# Patient Record
Sex: Male | Born: 1935 | Race: White | Hispanic: No | Marital: Married | Smoking: Never smoker
Health system: Southern US, Community
[De-identification: ages and names within clinical notes are randomized; demographics above are authoritative.]

## PROBLEM LIST (undated history)

## (undated) DIAGNOSIS — S065X9A Traumatic subdural hemorrhage with loss of consciousness of unspecified duration, initial encounter: Secondary | ICD-10-CM

## (undated) DIAGNOSIS — G459 Transient cerebral ischemic attack, unspecified: Secondary | ICD-10-CM

## (undated) DIAGNOSIS — I442 Atrioventricular block, complete: Secondary | ICD-10-CM

## (undated) DIAGNOSIS — I10 Essential (primary) hypertension: Secondary | ICD-10-CM

## (undated) DIAGNOSIS — I714 Abdominal aortic aneurysm, without rupture, unspecified: Secondary | ICD-10-CM

## (undated) DIAGNOSIS — E7800 Pure hypercholesterolemia, unspecified: Secondary | ICD-10-CM

## (undated) DIAGNOSIS — I4891 Unspecified atrial fibrillation: Principal | ICD-10-CM

## (undated) DIAGNOSIS — M1990 Unspecified osteoarthritis, unspecified site: Secondary | ICD-10-CM

## (undated) DIAGNOSIS — S065XAA Traumatic subdural hemorrhage with loss of consciousness status unknown, initial encounter: Secondary | ICD-10-CM

## (undated) DIAGNOSIS — E119 Type 2 diabetes mellitus without complications: Secondary | ICD-10-CM

## (undated) DIAGNOSIS — R32 Unspecified urinary incontinence: Secondary | ICD-10-CM

## (undated) HISTORY — PX: SUBDURAL HEMATOMA EVACUATION VIA CRANIOTOMY: SUR319

## (undated) HISTORY — PX: HERNIA REPAIR: SHX51

## (undated) HISTORY — PX: TONSILLECTOMY: SUR1361

## (undated) HISTORY — PX: ABDOMINAL AORTIC ANEURYSM REPAIR: SUR1152

---

## 2013-05-06 ENCOUNTER — Encounter (HOSPITAL_COMMUNITY): Payer: Self-pay | Admitting: Emergency Medicine

## 2013-05-06 ENCOUNTER — Inpatient Hospital Stay (HOSPITAL_COMMUNITY)
Admission: EM | Admit: 2013-05-06 | Discharge: 2013-05-12 | DRG: 242 | Disposition: A | Discharge status: Home / Self Care | Payer: Medicare Other | Attending: Internal Medicine | Admitting: Internal Medicine

## 2013-05-06 ENCOUNTER — Emergency Department (HOSPITAL_COMMUNITY): Payer: Medicare Other

## 2013-05-06 DIAGNOSIS — J9600 Acute respiratory failure, unspecified whether with hypoxia or hypercapnia: Secondary | ICD-10-CM

## 2013-05-06 DIAGNOSIS — I509 Heart failure, unspecified: Secondary | ICD-10-CM | POA: Diagnosis present

## 2013-05-06 DIAGNOSIS — I10 Essential (primary) hypertension: Secondary | ICD-10-CM | POA: Diagnosis present

## 2013-05-06 DIAGNOSIS — E876 Hypokalemia: Secondary | ICD-10-CM | POA: Diagnosis not present

## 2013-05-06 DIAGNOSIS — I498 Other specified cardiac arrhythmias: Secondary | ICD-10-CM | POA: Diagnosis present

## 2013-05-06 DIAGNOSIS — J9601 Acute respiratory failure with hypoxia: Secondary | ICD-10-CM | POA: Diagnosis present

## 2013-05-06 DIAGNOSIS — Z87891 Personal history of nicotine dependence: Secondary | ICD-10-CM

## 2013-05-06 DIAGNOSIS — Z91041 Radiographic dye allergy status: Secondary | ICD-10-CM

## 2013-05-06 DIAGNOSIS — J189 Pneumonia, unspecified organism: Secondary | ICD-10-CM | POA: Diagnosis present

## 2013-05-06 DIAGNOSIS — Z8673 Personal history of transient ischemic attack (TIA), and cerebral infarction without residual deficits: Secondary | ICD-10-CM

## 2013-05-06 DIAGNOSIS — Z79899 Other long term (current) drug therapy: Secondary | ICD-10-CM

## 2013-05-06 DIAGNOSIS — I442 Atrioventricular block, complete: Secondary | ICD-10-CM | POA: Diagnosis present

## 2013-05-06 DIAGNOSIS — J209 Acute bronchitis, unspecified: Secondary | ICD-10-CM | POA: Diagnosis present

## 2013-05-06 DIAGNOSIS — I5031 Acute diastolic (congestive) heart failure: Secondary | ICD-10-CM | POA: Diagnosis present

## 2013-05-06 DIAGNOSIS — I359 Nonrheumatic aortic valve disorder, unspecified: Secondary | ICD-10-CM | POA: Diagnosis present

## 2013-05-06 DIAGNOSIS — M7989 Other specified soft tissue disorders: Secondary | ICD-10-CM

## 2013-05-06 DIAGNOSIS — Z8249 Family history of ischemic heart disease and other diseases of the circulatory system: Secondary | ICD-10-CM

## 2013-05-06 DIAGNOSIS — E119 Type 2 diabetes mellitus without complications: Secondary | ICD-10-CM | POA: Diagnosis present

## 2013-05-06 DIAGNOSIS — R001 Bradycardia, unspecified: Secondary | ICD-10-CM | POA: Diagnosis present

## 2013-05-06 DIAGNOSIS — I4891 Unspecified atrial fibrillation: Principal | ICD-10-CM | POA: Diagnosis present

## 2013-05-06 DIAGNOSIS — Z7982 Long term (current) use of aspirin: Secondary | ICD-10-CM

## 2013-05-06 DIAGNOSIS — E785 Hyperlipidemia, unspecified: Secondary | ICD-10-CM | POA: Diagnosis present

## 2013-05-06 HISTORY — DX: Atrioventricular block, complete: I44.2

## 2013-05-06 HISTORY — DX: Transient cerebral ischemic attack, unspecified: G45.9

## 2013-05-06 HISTORY — DX: Type 2 diabetes mellitus without complications: E11.9

## 2013-05-06 HISTORY — DX: Abdominal aortic aneurysm, without rupture, unspecified: I71.40

## 2013-05-06 HISTORY — DX: Abdominal aortic aneurysm, without rupture: I71.4

## 2013-05-06 HISTORY — DX: Unspecified osteoarthritis, unspecified site: M19.90

## 2013-05-06 HISTORY — DX: Traumatic subdural hemorrhage with loss of consciousness status unknown, initial encounter: S06.5XAA

## 2013-05-06 HISTORY — DX: Pure hypercholesterolemia, unspecified: E78.00

## 2013-05-06 HISTORY — DX: Unspecified urinary incontinence: R32

## 2013-05-06 HISTORY — DX: Unspecified atrial fibrillation: I48.91

## 2013-05-06 HISTORY — DX: Traumatic subdural hemorrhage with loss of consciousness of unspecified duration, initial encounter: S06.5X9A

## 2013-05-06 HISTORY — DX: Essential (primary) hypertension: I10

## 2013-05-06 LAB — COMPREHENSIVE METABOLIC PANEL
ALT: 49 U/L (ref 0–53)
AST: 45 U/L — ABNORMAL HIGH (ref 0–37)
Albumin: 3.5 g/dL (ref 3.5–5.2)
Alkaline Phosphatase: 107 U/L (ref 39–117)
BILIRUBIN TOTAL: 1.2 mg/dL (ref 0.3–1.2)
BUN: 13 mg/dL (ref 6–23)
CALCIUM: 9.3 mg/dL (ref 8.4–10.5)
CHLORIDE: 100 meq/L (ref 96–112)
CO2: 21 meq/L (ref 19–32)
Creatinine, Ser: 0.92 mg/dL (ref 0.50–1.35)
GFR calc Af Amer: 89 mL/min — ABNORMAL LOW (ref >90)
GFR calc non Af Amer: 77 mL/min — ABNORMAL LOW (ref >90)
Glucose, Bld: 185 mg/dL — ABNORMAL HIGH (ref 70–99)
POTASSIUM: 4.1 meq/L (ref 3.7–5.3)
Sodium: 137 mEq/L (ref 137–147)
Total Protein: 7.2 g/dL (ref 6.0–8.3)

## 2013-05-06 LAB — CBC WITH DIFFERENTIAL/PLATELET
Basophils Absolute: 0 10*3/uL (ref 0.0–0.1)
Basophils Relative: 0 % (ref 0–1)
EOS ABS: 0 10*3/uL (ref 0.0–0.7)
Eosinophils Relative: 0 % (ref 0–5)
HCT: 42.3 % (ref 39.0–52.0)
HEMOGLOBIN: 14.4 g/dL (ref 13.0–17.0)
Lymphocytes Relative: 11 % — ABNORMAL LOW (ref 12–46)
Lymphs Abs: 1.2 10*3/uL (ref 0.7–4.0)
MCH: 31.1 pg (ref 26.0–34.0)
MCHC: 34 g/dL (ref 30.0–36.0)
MCV: 91.4 fL (ref 78.0–100.0)
MONO ABS: 0.8 10*3/uL (ref 0.1–1.0)
MONOS PCT: 8 % (ref 3–12)
NEUTROS PCT: 81 % — AB (ref 43–77)
Neutro Abs: 8.3 10*3/uL — ABNORMAL HIGH (ref 1.7–7.7)
Platelets: 147 10*3/uL — ABNORMAL LOW (ref 150–400)
RBC: 4.63 MIL/uL (ref 4.22–5.81)
RDW: 14.6 % (ref 11.5–15.5)
WBC: 10.3 10*3/uL (ref 4.0–10.5)

## 2013-05-06 LAB — TROPONIN I
Troponin I: <0.3 ng/mL (ref <0.30)
Troponin I: <0.3 ng/mL (ref <0.30)

## 2013-05-06 LAB — I-STAT TROPONIN, ED: Troponin i, poc: 0.05 ng/mL (ref 0.00–0.08)

## 2013-05-06 LAB — PRO B NATRIURETIC PEPTIDE: Pro B Natriuretic peptide (BNP): 4582 pg/mL — ABNORMAL HIGH (ref 0–450)

## 2013-05-06 LAB — TSH: TSH: 1.36 u[IU]/mL (ref 0.350–4.500)

## 2013-05-06 LAB — GLUCOSE, CAPILLARY: GLUCOSE-CAPILLARY: 175 mg/dL — AB (ref 70–99)

## 2013-05-06 LAB — STREP PNEUMONIAE URINARY ANTIGEN: Strep Pneumo Urinary Antigen: NEGATIVE

## 2013-05-06 LAB — MAGNESIUM: MAGNESIUM: 1.6 mg/dL (ref 1.5–2.5)

## 2013-05-06 MED ORDER — PANTOPRAZOLE SODIUM 40 MG PO TBEC
40.0000 mg | DELAYED_RELEASE_TABLET | Freq: Every day | ORAL | Status: DC
  Administered 2013-05-06 – 2013-05-12 (×7): 40 mg via ORAL
  Filled 2013-05-06 (×7): qty 1

## 2013-05-06 MED ORDER — ATORVASTATIN CALCIUM 40 MG PO TABS
40.0000 mg | ORAL_TABLET | Freq: Every day | ORAL | Status: DC
  Administered 2013-05-06 – 2013-05-11 (×5): 40 mg via ORAL
  Filled 2013-05-06 (×7): qty 1

## 2013-05-06 MED ORDER — SODIUM CHLORIDE 0.9 % IJ SOLN: 3.0000 mL | INTRAMUSCULAR | Status: DC | PRN

## 2013-05-06 MED ORDER — FUROSEMIDE 10 MG/ML IJ SOLN
60.0000 mg | Freq: Two times a day (BID) | INTRAMUSCULAR | Status: DC
  Filled 2013-05-06 (×3): qty 6

## 2013-05-06 MED ORDER — DEXTROSE 5 % IV SOLN
1.0000 g | Freq: Once | INTRAVENOUS | Status: AC
  Administered 2013-05-06: 1 g via INTRAVENOUS
  Filled 2013-05-06: qty 10

## 2013-05-06 MED ORDER — ENOXAPARIN SODIUM 40 MG/0.4ML ~~LOC~~ SOLN
40.0000 mg | SUBCUTANEOUS | Status: DC
  Administered 2013-05-06 – 2013-05-09 (×4): 40 mg via SUBCUTANEOUS
  Filled 2013-05-06 (×5): qty 0.4

## 2013-05-06 MED ORDER — SODIUM CHLORIDE 0.9 % IJ SOLN
3.0000 mL | Freq: Two times a day (BID) | INTRAMUSCULAR | Status: DC
  Administered 2013-05-06: 17:00:00 via INTRAVENOUS
  Administered 2013-05-06 – 2013-05-10 (×8): 3 mL via INTRAVENOUS

## 2013-05-06 MED ORDER — LISINOPRIL 20 MG PO TABS
20.0000 mg | ORAL_TABLET | Freq: Every day | ORAL | Status: DC
  Filled 2013-05-06: qty 1

## 2013-05-06 MED ORDER — IPRATROPIUM-ALBUTEROL 0.5-2.5 (3) MG/3ML IN SOLN
3.0000 mL | RESPIRATORY_TRACT | Status: DC
  Administered 2013-05-06: 3 mL via RESPIRATORY_TRACT
  Filled 2013-05-06: qty 3

## 2013-05-06 MED ORDER — INSULIN ASPART 100 UNIT/ML ~~LOC~~ SOLN
0.0000 [IU] | Freq: Three times a day (TID) | SUBCUTANEOUS | Status: DC
  Administered 2013-05-06: 3 [IU] via SUBCUTANEOUS
  Administered 2013-05-07: 5 [IU] via SUBCUTANEOUS
  Administered 2013-05-07 (×2): 3 [IU] via SUBCUTANEOUS
  Administered 2013-05-08: 18:00:00 via SUBCUTANEOUS
  Administered 2013-05-08 (×2): 3 [IU] via SUBCUTANEOUS
  Administered 2013-05-09: 2 [IU] via SUBCUTANEOUS
  Administered 2013-05-09 – 2013-05-10 (×3): 3 [IU] via SUBCUTANEOUS
  Administered 2013-05-10: 2 [IU] via SUBCUTANEOUS
  Administered 2013-05-11: 3 [IU] via SUBCUTANEOUS
  Administered 2013-05-11 (×2): 2 [IU] via SUBCUTANEOUS

## 2013-05-06 MED ORDER — ASPIRIN 325 MG PO TABS
325.0000 mg | ORAL_TABLET | Freq: Every evening | ORAL | Status: DC
  Administered 2013-05-06 – 2013-05-11 (×5): 325 mg via ORAL
  Filled 2013-05-06 (×7): qty 1

## 2013-05-06 MED ORDER — DEXTROSE 5 % IV SOLN
500.0000 mg | Freq: Once | INTRAVENOUS | Status: AC
  Administered 2013-05-06: 500 mg via INTRAVENOUS

## 2013-05-06 MED ORDER — AMANTADINE HCL 100 MG PO CAPS
100.0000 mg | ORAL_CAPSULE | Freq: Every day | ORAL | Status: DC
  Administered 2013-05-07 – 2013-05-12 (×6): 100 mg via ORAL
  Filled 2013-05-06 (×6): qty 1

## 2013-05-06 MED ORDER — DEXTROSE 5 % IV SOLN
1.0000 g | INTRAVENOUS | Status: DC
  Administered 2013-05-07: 1 g via INTRAVENOUS
  Filled 2013-05-06 (×2): qty 10

## 2013-05-06 MED ORDER — SODIUM CHLORIDE 0.9 % IV SOLN: 250.0000 mL | INTRAVENOUS | Status: DC | PRN

## 2013-05-06 MED ORDER — DEXTROSE 5 % IV SOLN
500.0000 mg | INTRAVENOUS | Status: DC
  Administered 2013-05-07: 500 mg via INTRAVENOUS
  Filled 2013-05-06 (×2): qty 500

## 2013-05-06 MED ORDER — FUROSEMIDE 10 MG/ML IJ SOLN
40.0000 mg | Freq: Once | INTRAMUSCULAR | Status: AC
  Administered 2013-05-06: 40 mg via INTRAVENOUS
  Filled 2013-05-06: qty 4

## 2013-05-06 MED ORDER — FINASTERIDE 5 MG PO TABS
5.0000 mg | ORAL_TABLET | Freq: Every day | ORAL | Status: DC
  Administered 2013-05-06 – 2013-05-12 (×7): 5 mg via ORAL
  Filled 2013-05-06 (×7): qty 1

## 2013-05-06 MED ORDER — FUROSEMIDE 10 MG/ML IJ SOLN
60.0000 mg | Freq: Two times a day (BID) | INTRAMUSCULAR | Status: DC
Start: 2013-05-07 — End: 2013-05-06

## 2013-05-06 NOTE — ED Notes (Signed)
Repot given-transfer to [phone]

## 2013-05-06 NOTE — ED Notes (Signed)
CMP and CBC with Differential yellow labels not found/printed. Natasha from lab confirms okay to sent those with regular patient labels.

## 2013-05-06 NOTE — ED Notes (Signed)
Off floor for testing 

## 2013-05-06 NOTE — ED Notes (Signed)
Rachel Covil at bedside attempting second blood culture draw.

## 2013-05-06 NOTE — H&P (Addendum)
[facility] History and Physical  Grant Peck PYK:[id number] DOB: 1931-01-05 DOA: 05/06/2013   PCP: Dr. Mervyn Peck? in [facility] Specialists: He is followed by a cardiologist in [place] area  Chief Complaint: Shortness of breath and cough for the last 2 days  HPI: Grant Peck is a 80 y.o. male with a past medical history of atrial fibrillation not on anticoagulation, diabetes mellitus type II, hypertension, who denies any history of congestive heart failure who was in his usual state of health about 2 days ago, when he started developing shortness of breath, and cough. He's felt very weak and fatigued. He's had a greenish expectoration with his cough. Denies any fever or chills. No sick contacts. The symptoms slowly got worse and he decided to come in to the hospital today. He had an episode of vomiting 2 days ago, but none since then. He is from [place] visiting his son in [place] as his son was newly diagnosed with throat cancer and underwent a surgical procedure recently. Denies any leg swelling. 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 It was an hour and a half flight, but he walked many times during this flight according to the patient. He denies any chest pain. No dizziness or lightheadedness. He was given a breathing treatment earlier today and he is feeling little better. He is yet to receive his dose of Lasix. Denies any recent hospitalization.  Home Medications: Prior to Admission medications   Medication Sig Start Date End Date Taking? Authorizing Provider  amantadine (SYMMETREL) 100 MG capsule Take 100 mg by mouth daily.   Yes Historical Provider, MD  aspirin 325 MG tablet Take 325 mg by mouth every evening.   Yes Historical Provider, MD  dextromethorphan (DELSYM) 30 MG/5ML liquid Take 30 mLs by mouth as needed for cough.   Yes Historical Provider, MD  diltiazem (CARDIZEM CD) 120 MG 24 hr capsule Take 120 mg by mouth daily.   Yes Historical  Provider, MD  finasteride (PROSCAR) 5 MG tablet Take 5 mg by mouth daily.   Yes Historical Provider, MD  fosinopril (MONOPRIL) 20 MG tablet Take 20 mg by mouth daily.   Yes Historical Provider, MD  metFORMIN (GLUCOPHAGE) 500 MG tablet Take 750 mg by mouth 2 (two) times daily with a meal.   Yes Historical Provider, MD  metoprolol succinate (TOPROL-XL) 50 MG 24 hr tablet Take 50 mg by mouth daily. Take with or immediately following a meal.   Yes Historical Provider, MD  omeprazole (PRILOSEC) 40 MG capsule Take 40 mg by mouth daily.   Yes Historical Provider, MD  Polyvinyl Alcohol-Povidone (CLEAR EYES ALL SEASONS) 5-6 MG/ML SOLN Place 1 drop into both eyes daily as needed (For dry eyes).   Yes Historical Provider, MD  rosuvastatin (CRESTOR) 20 MG tablet Take 20 mg by mouth at bedtime.   Yes Historical Provider, MD  terazosin (HYTRIN) 10 MG capsule Take 10 mg by mouth at bedtime.   Yes Historical Provider, MD    Allergies:  Allergies  Allergen Reactions  . Ivp Dye [Iodinated Diagnostic Agents] Other (See Comments)    Very "hot feeling", flushed     Past Medical History: Past Medical History  Diagnosis Date  . Abdominal aortic aneurysm   . Subdural hematoma   . Urinary incontinence   . Diabetes mellitus without complication   . Hypertension   . Hypercholesteremia   . TIA (transient ischemic attack)   . Arthritis   . Atrial fibrillation     Past Surgical  History  Procedure Laterality Date  . Abdominal aortic aneurysm repair    . Hernia repair    . Subdural hematoma evacuation via craniotomy      Social History: Patient lives with his wife and another son in [place]. He quit smoking 40 years ago. No alcohol use. No illicit drug use. Usually independent with daily activities.  Family History: there is history of cerebral hemorrhage in his mother. His father died of digitalis toxicity.  Review of Systems - History obtained from the patient General ROS: positive for  -  fatigue Psychological ROS: negative Ophthalmic ROS: negative ENT ROS: negative Allergy and Immunology ROS: negative Hematological and Lymphatic ROS: negative Endocrine ROS: negative Respiratory ROS: as in hpi Cardiovascular ROS: as in hpi Gastrointestinal ROS: as in hpi Genito-Urinary ROS: no dysuria, trouble voiding, or hematuria Musculoskeletal ROS: negative Neurological ROS: no TIA or stroke symptoms Dermatological ROS: negative  Physical Examination  Filed Vitals:   05/06/13 1219  BP: 159/81  Pulse: 47  Temp: 97.5 F (36.4 C)  TempSrc: Oral  Resp: 16  SpO2: 92%    BP 159/81  Pulse 47  Temp(Src) 97.5 F (36.4 C) (Oral)  Resp 16  SpO2 92%  Tele: HR mostly in 60's but occasionally drops to 40's.  General appearance: alert, cooperative, appears stated age, no distress and moderately obese Head: Normocephalic, without obvious abnormality, atraumatic Eyes: conjunctivae/corneas clear. PERRL, EOM's intact.  Throat: lips, mucosa, and tongue normal; teeth and gums normal Neck: no adenopathy, no carotid bruit, no JVD, supple, symmetrical, trachea midline and thyroid not enlarged, symmetric, no tenderness/mass/nodules Resp: Crackles bilateral bases about one third of the lung field. No wheezing. Cardio: S1-S2 is irregularly irregular, occasionally bradycardic. No S3, S4. No rubs, murmurs, or bruit. 1-2+ pitting edema noted. Bilateral lower extremities. JVD was not visualized. GI: soft, non-tender; bowel sounds normal; no masses,  no organomegaly Extremities: edema 1-2+. Right leg appears to be larger than left. No erythema. No calf tenderness Pulses: 2+ and symmetric Skin: Skin color, texture, turgor normal. No rashes or lesions Lymph nodes: Cervical, supraclavicular, and axillary nodes normal. Neurologic: Alert and oriented x3. Cranial nerves intact. No motor strength deficits bilaterally. Gait not assessed.  Laboratory Data: Results for orders placed during the hospital  encounter of 05/06/13 (from the past 48 hour(s))  COMPREHENSIVE METABOLIC PANEL     Status: Abnormal   Collection Time    05/06/13  1:45 PM      Result Value Ref Range   Sodium 137  137 - 147 mEq/L   Potassium 4.1  3.7 - 5.3 mEq/L   Chloride 100  96 - 112 mEq/L   CO2 21  19 - 32 mEq/L   Glucose, Bld 185 (*) 70 - 99 mg/dL   BUN 13  6 - 23 mg/dL   Creatinine, Ser 0.92  0.50 - 1.35 mg/dL   Calcium 9.3  8.4 - 10.5 mg/dL   Total Protein 7.2  6.0 - 8.3 g/dL   Albumin 3.5  3.5 - 5.2 g/dL   AST 45 (*) 0 - 37 U/L   ALT 49  0 - 53 U/L   Alkaline Phosphatase 107  39 - 117 U/L   Total Bilirubin 1.2  0.3 - 1.2 mg/dL   GFR calc non Af Amer 77 (*) >90 mL/min   GFR calc Af Amer 89 (*) >90 mL/min   Comment: (NOTE)     The eGFR has been calculated using the CKD EPI equation.  This calculation has not been validated in all clinical situations.     eGFR's persistently <90 mL/min signify possible Chronic Kidney     Disease.  CBC WITH DIFFERENTIAL     Status: Abnormal   Collection Time    05/06/13  1:45 PM      Result Value Ref Range   WBC 10.3  4.0 - 10.5 K/uL   RBC 4.63  4.22 - 5.81 MIL/uL   Hemoglobin 14.4  13.0 - 17.0 g/dL   HCT 42.3  39.0 - 52.0 %   MCV 91.4  78.0 - 100.0 fL   MCH 31.1  26.0 - 34.0 pg   MCHC 34.0  30.0 - 36.0 g/dL   RDW 14.6  11.5 - 15.5 %   Platelets 147 (*) 150 - 400 K/uL   Neutrophils Relative % 81 (*) 43 - 77 %   Neutro Abs 8.3 (*) 1.7 - 7.7 K/uL   Lymphocytes Relative 11 (*) 12 - 46 %   Lymphs Abs 1.2  0.7 - 4.0 K/uL   Monocytes Relative 8  3 - 12 %   Monocytes Absolute 0.8  0.1 - 1.0 K/uL   Eosinophils Relative 0  0 - 5 %   Eosinophils Absolute 0.0  0.0 - 0.7 K/uL   Basophils Relative 0  0 - 1 %   Basophils Absolute 0.0  0.0 - 0.1 K/uL  PRO B NATRIURETIC PEPTIDE     Status: Abnormal   Collection Time    05/06/13  1:45 PM      Result Value Ref Range   Pro B Natriuretic peptide (BNP) 4582.0 (*) 0 - 450 pg/mL  I-STAT TROPOININ, ED     Status: None    Collection Time    05/06/13  1:59 PM      Result Value Ref Range   Troponin i, poc 0.05  0.00 - 0.08 ng/mL   Comment 3            Comment: Due to the release kinetics of cTnI,     a negative result within the first hours     of the onset of symptoms does not rule out     myocardial infarction with certainty.     If myocardial infarction is still suspected,     repeat the test at appropriate intervals.    Radiology Reports: Dg Chest 2 View  05/06/2013   CLINICAL DATA:  COUGH SHORTNESS OF BREATH  EXAM: CHEST  2 VIEW  COMPARISON:  None.  FINDINGS: Low lung volumes. Cardiac silhouette is enlarged. Diffuse bilateral pulmonary opacities appreciated left greater than right. There is mild prominence of interstitial markings and areas of mild peribronchial cuffing. Increased density within the lung bases right greater than left. Blunting of the costophrenic angles. Mild osteoarthritic changes within the shoulders.  IMPRESSION: Interstitial findings likely reflecting pulmonary edema  Areas of atelectasis versus asymmetric edema within the lung bases  Trace bilateral effusions.  A non edematous infiltrate particularly within the right lung base cannot be excluded. Continued surveillance evaluation recommended.   Electronically Signed   By: Margaree Mackintosh M.D.   On: 05/06/2013 13:12    Electrocardiogram: Bradycardic in the 40s. Could be atrial fibrillation, though the rhythm is difficult to ascertain. Right bundle branch block. Possible QT prolongation. Interventricular conduction delay. No older EKGs available for comparison  Problem List  Principal Problem:   Acute CHF (congestive heart failure) Active Problems:   CAP (community acquired pneumonia)   Bradycardia  Atrial fibrillation   DM type 2 (diabetes mellitus, type 2)   Acute respiratory failure with hypoxia   Assessment: This is an 80 year old, Caucasian male, who presents with shortness of breath, cough, and was found to be hypoxic in  the emergency department with saturations in the 80s. With oxygen he is up to mid 90s. Chest x-ray is concerning for pulmonary edema. An infiltrate cannot be ruled out within the right base. Venous thromboembolism was considered in the differential however, with these x-ray findings, VTE seems less likely.  Plan: #1 acute respiratory failure with hypoxia, likely secondary to pulmonary edema, likely due to congestive heart failure. He does have cardiomegaly. He does not have any known history of CHF. He'll be prescribed intravenous Lasix. Strict ins and outs daily weights. Electrolytes will be monitored closely. Oxygen. Troponins will be trended. Echocardiogram will be ordered. EKG will be repeated in the morning.  #2 possible infiltrate in the right lower lung. He does have greenish expectoration. However, he is afebrile and has a normal WBC. For, now we will continue ceftriaxone and azithromycin. Chest x-ray could be repeated after diuresis and if there is resolution of the infiltrates antibiotics can be discontinued..  #3 history of atrial fibrillation, currently bradycardic at times: He is asymptomatic. He'll be monitored on telemetry. We will hold his rate limiting drugs for now. TSH will be checked. Echocardiogram as discussed above. He denies being on anticoagulation and is only on aspirin. This is presumably due to his history of subdural hematoma 2 years ago. If HR does not improve with cessation of BB and CCB or if he decompensates cardiology input can be obtained.  #4 diabetes mellitus, type II: Hold his metformin. Place him on sliding scale insulin coverage. HbA1c will be checked.  #5 Enlarged Right Leg: No obvious erythema or calf tenderness. Will proceed with venous doppler.  #6 history of subdural hematoma 2 years ago: No recent falls or injuries. No neurological deficits.   DVT Prophylaxis: Lovenox Code Status: Full code Family Communication: Discussed with patient.  Disposition  Plan: Admit to telemetry. He is originally from [place]. He's currently staying at his son's place.   Further management decisions will depend on results of further testing and patient's response to treatment.   Bonnielee Haff  [facility] Pager [phone]  If 7PM-7AM, please contact night-coverage [email or web address] Password [email or web address]  05/06/2013, 3:11 PM

## 2013-05-06 NOTE — ED Notes (Signed)
Pt reports cough x5 days, shob x4 days. Productive cough, small amt brown-green mucus. Denies chest pain, lightheadedness, diaphoresis. P 46 in triage. Sts he has never been told anything about being bradycardic. Sees multiple physicians on a regular basis.

## 2013-05-06 NOTE — ED Provider Notes (Signed)
CSN: [id number]     Arrival date & time 05/06/13  1209 History   First MD Initiated Contact with Patient 05/06/13 1232     Chief Complaint  Patient presents with  . Cough  . Shortness of Breath     (Consider location/radiation/quality/duration/timing/severity/associated sxs/prior Treatment) Patient is a 80 y.o. male presenting with cough and shortness of breath. The history is provided by the patient.  Cough Cough characteristics:  Productive Sputum characteristics:  Green and yellow Severity:  Moderate Onset quality:  Gradual Duration:  4 days Timing:  Constant Progression:  Unchanged Chronicity:  New Smoker: no   Context: sick contacts   Context: not upper respiratory infection and not weather changes   Relieved by:  Nothing Worsened by:  Nothing tried Associated symptoms: shortness of breath   Associated symptoms: no chest pain, no chills and no fever   Shortness of Breath Associated symptoms: cough   Associated symptoms: no chest pain and no fever     Past Medical History  Diagnosis Date  . Abdominal aortic aneurysm   . Subdural hematoma   . Urinary incontinence   . Diabetes mellitus without complication   . Hypertension   . Hypercholesteremia   . TIA (transient ischemic attack)   . Arthritis   . Atrial fibrillation    Past Surgical History  Procedure Laterality Date  . Abdominal aortic aneurysm repair    . Hernia repair    . Subdural hematoma evacuation via craniotomy     No family history on file. History  Substance Use Topics  . Smoking status: Never Smoker   . Smokeless tobacco: Not on file  . Alcohol Use: No    Review of Systems  Constitutional: Negative for fever and chills.  Respiratory: Positive for cough and shortness of breath.   Cardiovascular: Negative for chest pain and leg swelling.  All other systems reviewed and are negative.     Allergies  Ivp dye  Home Medications  No current outpatient prescriptions on file. BP 159/81   Pulse 47  Temp(Src) 97.5 F (36.4 C) (Oral)  Resp 16  SpO2 92% Physical Exam  Nursing note and vitals reviewed. Constitutional: He is oriented to person, place, and time. He appears well-developed and well-nourished. No distress.  HENT:  Head: Normocephalic and atraumatic.  Mouth/Throat: No oropharyngeal exudate.  Eyes: EOM are normal. Pupils are equal, round, and reactive to light.  Neck: Normal range of motion. Neck supple.  Cardiovascular: Regular rhythm.  Bradycardia present.  Exam reveals no friction rub.   No murmur heard. Pulmonary/Chest: Effort normal. No respiratory distress. He has decreased breath sounds in the left upper field, the left middle field and the left lower field. He has no wheezes. He has no rales.  Abdominal: He exhibits no distension. There is no tenderness. There is no rebound.  Musculoskeletal: Normal range of motion. He exhibits no edema.  Neurological: He is alert and oriented to person, place, and time. No cranial nerve deficit. He exhibits normal muscle tone. Coordination normal.  Skin: No rash noted. He is not diaphoretic.    ED Course  Procedures (including critical care time) Labs Review Labs Reviewed  COMPREHENSIVE METABOLIC PANEL  CBC WITH DIFFERENTIAL  Rosezena Sensor, ED   Imaging Review Dg Chest 2 View  05/06/2013   CLINICAL DATA:  COUGH SHORTNESS OF BREATH  EXAM: CHEST  2 VIEW  COMPARISON:  None.  FINDINGS: Low lung volumes. Cardiac silhouette is enlarged. Diffuse bilateral pulmonary opacities appreciated left greater than  right. There is mild prominence of interstitial markings and areas of mild peribronchial cuffing. Increased density within the lung bases right greater than left. Blunting of the costophrenic angles. Mild osteoarthritic changes within the shoulders.  IMPRESSION: Interstitial findings likely reflecting pulmonary edema  Areas of atelectasis versus asymmetric edema within the lung bases  Trace bilateral effusions.  A non  edematous infiltrate particularly within the right lung base cannot be excluded. Continued surveillance evaluation recommended.   Electronically Signed   By: Salome HolmesHector  Cooper M.D.   On: 05/06/2013 13:12     EKG Interpretation   Date/Time:  Saturday May 06 2013 12:39:49 EDT Ventricular Rate:  46 PR Interval:  234 QRS Duration: 143 QT Interval:  675 QTC Calculation: 591 R Axis:   -126 Text Interpretation:  Sinus bradycardia Prolonged PR interval Right bundle  branch block Inferior infarct, old Anteroseptal infarct, age indeterminate  Flutter waves appreciated No prior Confirmed by Gwendolyn GrantWALDEN  MD, Besan Ketchem ([id number])  on 05/06/2013 1:07:50 PM      MDM   Final diagnoses:  Pneumonia  CHF (congestive heart failure)    72[place] here with cough, SOB for 4-5 days. No fevers. Cough productive. No dizziness, CP, diaphoresis. Hx of Afib, hasn't taken meds today. Bradycardic here with sinus bradycardia - flutter waves appreciated in I, II, III.  Lungs with decreased air movement on L, concern for possible pneumonia. Will Xray chest and obtain labs. CXR concerning for infiltrate. Cultures drawn, antibiotics drawn. BNP elevated at 4582 - no hx of CHF. Lasix given. Patient admitted to medicine for pneumonia with hypoxia - CHF evaluation.   Dagmar HaitWilliam Yaretsi Humphres, MD 05/06/13 [id number]

## 2013-05-06 NOTE — Progress Notes (Signed)
VASCULAR LAB PRELIMINARY  PRELIMINARY  PRELIMINARY  PRELIMINARY  Bilateral lower extremity venous Dopplers completed.    Preliminary report:  There is no DVT or SVT noted in the bilateral lower extremities.  Kern AlbertaCandace R Shirly Bartosiewicz, RVT 05/06/2013, 5:32 PM

## 2013-05-06 NOTE — Progress Notes (Signed)
Pharmacy may adjust antibiotics: on Ceftriaxone and Axithromycin. Neither needs adjustment for renal function.   Will follow clinical course, consider de-escalation to po abx when appropriate.  Thank you, Otho BellowsGreen, Veleria Barnhardt L PharmD Pager [phone] 05/06/2013, 3:47 PM

## 2013-05-07 ENCOUNTER — Encounter (HOSPITAL_COMMUNITY): Payer: Self-pay | Admitting: Cardiology

## 2013-05-07 DIAGNOSIS — E119 Type 2 diabetes mellitus without complications: Secondary | ICD-10-CM

## 2013-05-07 DIAGNOSIS — I509 Heart failure, unspecified: Secondary | ICD-10-CM

## 2013-05-07 DIAGNOSIS — I4891 Unspecified atrial fibrillation: Principal | ICD-10-CM

## 2013-05-07 DIAGNOSIS — I498 Other specified cardiac arrhythmias: Secondary | ICD-10-CM

## 2013-05-07 DIAGNOSIS — I359 Nonrheumatic aortic valve disorder, unspecified: Secondary | ICD-10-CM

## 2013-05-07 LAB — BASIC METABOLIC PANEL
BUN: 15 mg/dL (ref 6–23)
CO2: 26 mEq/L (ref 19–32)
Calcium: 8.9 mg/dL (ref 8.4–10.5)
Chloride: 100 mEq/L (ref 96–112)
Creatinine, Ser: 1.01 mg/dL (ref 0.50–1.35)
GFR calc Af Amer: 78 mL/min — ABNORMAL LOW (ref >90)
GFR, EST NON AFRICAN AMERICAN: 68 mL/min — AB (ref >90)
GLUCOSE: 157 mg/dL — AB (ref 70–99)
POTASSIUM: 3.6 meq/L — AB (ref 3.7–5.3)
Sodium: 139 mEq/L (ref 137–147)

## 2013-05-07 LAB — GLUCOSE, CAPILLARY
GLUCOSE-CAPILLARY: 155 mg/dL — AB (ref 70–99)
GLUCOSE-CAPILLARY: 215 mg/dL — AB (ref 70–99)
Glucose-Capillary: 157 mg/dL — ABNORMAL HIGH (ref 70–99)
Glucose-Capillary: 157 mg/dL — ABNORMAL HIGH (ref 70–99)

## 2013-05-07 LAB — HEMOGLOBIN A1C
HEMOGLOBIN A1C: 8 % — AB (ref <5.7)
Mean Plasma Glucose: 183 mg/dL — ABNORMAL HIGH (ref <117)

## 2013-05-07 LAB — CBC
HEMATOCRIT: 41.1 % (ref 39.0–52.0)
Hemoglobin: 13.5 g/dL (ref 13.0–17.0)
MCH: 30.4 pg (ref 26.0–34.0)
MCHC: 32.8 g/dL (ref 30.0–36.0)
MCV: 92.6 fL (ref 78.0–100.0)
PLATELETS: 146 10*3/uL — AB (ref 150–400)
RBC: 4.44 MIL/uL (ref 4.22–5.81)
RDW: 14.7 % (ref 11.5–15.5)
WBC: 8.6 10*3/uL (ref 4.0–10.5)

## 2013-05-07 LAB — LEGIONELLA ANTIGEN, URINE: LEGIONELLA ANTIGEN, URINE: NEGATIVE

## 2013-05-07 LAB — PRO B NATRIURETIC PEPTIDE: Pro B Natriuretic peptide (BNP): 2384 pg/mL — ABNORMAL HIGH (ref 0–450)

## 2013-05-07 LAB — TROPONIN I: Troponin I: <0.3 ng/mL (ref <0.30)

## 2013-05-07 MED ORDER — FUROSEMIDE 10 MG/ML IJ SOLN
40.0000 mg | Freq: Two times a day (BID) | INTRAMUSCULAR | Status: DC
  Administered 2013-05-07 – 2013-05-08 (×4): 40 mg via INTRAVENOUS
  Filled 2013-05-07 (×6): qty 4

## 2013-05-07 MED ORDER — HYDRALAZINE HCL 20 MG/ML IJ SOLN
10.0000 mg | Freq: Four times a day (QID) | INTRAMUSCULAR | Status: DC | PRN
Start: 2013-05-07 — End: 2013-05-10
  Administered 2013-05-07 – 2013-05-10 (×2): 10 mg via INTRAVENOUS
  Filled 2013-05-07: qty 0.5
  Filled 2013-05-07: qty 1
  Filled 2013-05-07: qty 0.5

## 2013-05-07 MED ORDER — POTASSIUM CHLORIDE CRYS ER 20 MEQ PO TBCR
40.0000 meq | EXTENDED_RELEASE_TABLET | Freq: Once | ORAL | Status: AC
  Administered 2013-05-07: 40 meq via ORAL
  Filled 2013-05-07: qty 2

## 2013-05-07 MED ORDER — LISINOPRIL 40 MG PO TABS
40.0000 mg | ORAL_TABLET | Freq: Every day | ORAL | Status: DC
  Administered 2013-05-07 – 2013-05-12 (×6): 40 mg via ORAL
  Filled 2013-05-07 (×8): qty 1

## 2013-05-07 NOTE — Progress Notes (Signed)
Echocardiogram 2D Echocardiogram has been performed.  Estelle GrumblesMelissa J Itay Mella 05/07/2013, 3:41 PM

## 2013-05-07 NOTE — Consult Note (Signed)
HPI: 80 year old male with past medical history of diabetes mellitus, hypertension, hyperlipidemia, prior abdominal aortic aneurysm repair, atrial fibrillation and subdural hematoma for evaluation of atrial fibrillation and congestive heart failure. Patient lives in [place] and is followed by a cardiologist there. He is visiting [place] to see his son who is undergoing a medical procedure. 2 days prior to admission he complains of progressive dyspnea on exertion. No orthopnea, PND, pedal edema, chest pain, palpitations or syncope. He has a productive cough. By his report he has a history of atrial fibrillation but no other cardiac history. He was on Coumadin in the past but the decision was made to treat with aspirin. No records are available.  Medications Prior to Admission  Medication Sig Dispense Refill  . amantadine (SYMMETREL) 100 MG capsule Take 100 mg by mouth daily.      Marland Kitchen aspirin 325 MG tablet Take 325 mg by mouth every evening.      Marland Kitchen dextromethorphan (DELSYM) 30 MG/5ML liquid Take 30 mLs by mouth as needed for cough.      . diltiazem (CARDIZEM CD) 120 MG 24 hr capsule Take 120 mg by mouth daily.      . finasteride (PROSCAR) 5 MG tablet Take 5 mg by mouth daily.      . fosinopril (MONOPRIL) 20 MG tablet Take 20 mg by mouth daily.      . metFORMIN (GLUCOPHAGE) 500 MG tablet Take 750 mg by mouth 2 (two) times daily with a meal.      . metoprolol succinate (TOPROL-XL) 50 MG 24 hr tablet Take 50 mg by mouth daily. Take with or immediately following a meal.      . omeprazole (PRILOSEC) 40 MG capsule Take 40 mg by mouth daily.      . Polyvinyl Alcohol-Povidone (CLEAR EYES ALL SEASONS) 5-6 MG/ML SOLN Place 1 drop into both eyes daily as needed (For dry eyes).      . rosuvastatin (CRESTOR) 20 MG tablet Take 20 mg by mouth at bedtime.      Marland Kitchen terazosin (HYTRIN) 10 MG capsule Take 10 mg by mouth at bedtime.        Allergies  Allergen Reactions  . Ivp Dye [Iodinated [employer]  [employer]] Other (See Comments)    Very "hot feeling", flushed     Past Medical History  Diagnosis Date  . Abdominal aortic aneurysm   . Subdural hematoma   . Urinary incontinence   . Diabetes mellitus without complication   . Hypertension   . Hypercholesteremia   . TIA (transient ischemic attack)   . Arthritis   . Atrial fibrillation     Past Surgical History  Procedure Laterality Date  . Abdominal aortic aneurysm repair    . Hernia repair    . Subdural hematoma evacuation via craniotomy    . Tonsillectomy      History   Social History  . Marital Status: Married    Spouse Name: N/A    Number of Children: 6  . Years of Education: N/A   Occupational History  . Not on file.   Social History Main Topics  . Smoking status: Never Smoker   . Smokeless tobacco: Not on file  . Alcohol Use: No  . Drug Use: Yes  . Sexual Activity: Not on file   Other Topics Concern  . Not on file   Social History Narrative  . No narrative on file    Family History  Problem Relation Age of Onset  .  Heart disease Father     ROS:  Productive cough but no fevers or chills, hemoptysis, dysphasia, odynophagia, melena, hematochezia, dysuria, hematuria, rash, seizure activity, orthopnea, PND, pedal edema, claudication. Remaining systems are negative.  Physical Exam:   Blood pressure 171/60, pulse 37, temperature 97.9 F (36.6 C), temperature source Oral, resp. rate 24, height $RemoveBe'5\' 7"'ugMfwlAKF$  (1.702 m), weight 206 lb 12.7 oz (93.8 kg), SpO2 97.00%.  General:  Well developed/obese in NAD Skin warm/dry Patient not depressed No peripheral clubbing Back-normal HEENT-normal/normal eyelids Neck supple/normal carotid upstroke bilaterally; no bruits; no thyromegaly chest - mild basilar crackles CV - irregular/bradycardic/normal S1 and S2; no murmurs, rubs or gallops;  PMI nondisplaced Abdomen -NT/ND, no HSM, no mass, + bowel sounds, no bruit 2+ femoral pulses, no bruits Ext-no edema, no chords, 2+ DP  ont the right and diminished on the left Neuro-grossly nonfocal  ECG atrial fibrillation with a slow ventricular response, right bundle branch block, anterior infarct, inferior infarct.  Results for orders placed during the hospital encounter of 05/06/13 (from the past 48 hour(s))  COMPREHENSIVE METABOLIC PANEL     Status: Abnormal   Collection Time    05/06/13  1:45 PM      Result Value Ref Range   Sodium 137  137 - 147 mEq/L   Potassium 4.1  3.7 - 5.3 mEq/L   Chloride 100  96 - 112 mEq/L   CO2 21  19 - 32 mEq/L   Glucose, Bld 185 (*) 70 - 99 mg/dL   BUN 13  6 - 23 mg/dL   Creatinine, Ser 0.92  0.50 - 1.35 mg/dL   Calcium 9.3  8.4 - 10.5 mg/dL   Total Protein 7.2  6.0 - 8.3 g/dL   Albumin 3.5  3.5 - 5.2 g/dL   AST 45 (*) 0 - 37 U/L   ALT 49  0 - 53 U/L   Alkaline Phosphatase 107  39 - 117 U/L   Total Bilirubin 1.2  0.3 - 1.2 mg/dL   GFR calc non Af Amer 77 (*) >90 mL/min   GFR calc Af Amer 89 (*) >90 mL/min   Comment: (NOTE)     The eGFR has been calculated using the CKD EPI equation.     This calculation has not been validated in all clinical situations.     eGFR's persistently <90 mL/min signify possible Chronic Kidney     Disease.  CBC WITH DIFFERENTIAL     Status: Abnormal   Collection Time    05/06/13  1:45 PM      Result Value Ref Range   WBC 10.3  4.0 - 10.5 K/uL   RBC 4.63  4.22 - 5.81 MIL/uL   Hemoglobin 14.4  13.0 - 17.0 g/dL   HCT 42.3  39.0 - 52.0 %   MCV 91.4  78.0 - 100.0 fL   MCH 31.1  26.0 - 34.0 pg   MCHC 34.0  30.0 - 36.0 g/dL   RDW 14.6  11.5 - 15.5 %   Platelets 147 (*) 150 - 400 K/uL   Neutrophils Relative % 81 (*) 43 - 77 %   Neutro Abs 8.3 (*) 1.7 - 7.7 K/uL   Lymphocytes Relative 11 (*) 12 - 46 %   Lymphs Abs 1.2  0.7 - 4.0 K/uL   Monocytes Relative 8  3 - 12 %   Monocytes Absolute 0.8  0.1 - 1.0 K/uL   Eosinophils Relative 0  0 - 5 %   Eosinophils Absolute 0.0  0.0 - 0.7 K/uL   Basophils Relative 0  0 - 1 %   Basophils Absolute 0.0  0.0 -  0.1 K/uL  PRO B NATRIURETIC PEPTIDE     Status: Abnormal   Collection Time    05/06/13  1:45 PM      Result Value Ref Range   Pro B Natriuretic peptide (BNP) 4582.0 (*) 0 - 450 pg/mL  I-STAT TROPOININ, ED     Status: None   Collection Time    05/06/13  1:59 PM      Result Value Ref Range   Troponin i, poc 0.05  0.00 - 0.08 ng/mL   Comment 3            Comment: Due to the release kinetics of cTnI,     a negative result within the first hours     of the onset of symptoms does not rule out     myocardial infarction with certainty.     If myocardial infarction is still suspected,     repeat the test at appropriate intervals.  TROPONIN I     Status: None   Collection Time    05/06/13  4:05 PM      Result Value Ref Range   Troponin I <0.30  <0.30 ng/mL   Comment:            Due to the release kinetics of cTnI,     a negative result within the first hours     of the onset of symptoms does not rule out     myocardial infarction with certainty.     If myocardial infarction is still suspected,     repeat the test at appropriate intervals.  TSH     Status: None   Collection Time    05/06/13  4:05 PM      Result Value Ref Range   TSH 1.360  0.350 - 4.500 uIU/mL   Comment: Please note change in reference range.     Performed at [facility]     Status: None   Collection Time    05/06/13  4:05 PM      Result Value Ref Range   Magnesium 1.6  1.5 - 2.5 mg/dL  GLUCOSE, [facility]     Status: Abnormal   Collection Time    05/06/13  5:13 PM      Result Value Ref Range   Glucose-Capillary 175 (*) 70 - 99 mg/dL  STREP PNEUMONIAE URINARY ANTIGEN     Status: None   Collection Time    05/06/13  5:25 PM      Result Value Ref Range   Strep Pneumo Urinary Antigen NEGATIVE  NEGATIVE   Comment:            Infection due to S. pneumoniae     cannot be absolutely ruled out     since the antigen present     may be below the detection limit     of the test.     Performed at  [facility], [facility]     Status: Abnormal   Collection Time    05/06/13  9:41 PM      Result Value Ref Range   Glucose-Capillary 155 (*) 70 - 99 mg/dL  TROPONIN I     Status: None   Collection Time    05/06/13  9:45 PM      Result Value Ref Range   Troponin I <0.30  <  0.30 ng/mL   Comment:            Due to the release kinetics of cTnI,     a negative result within the first hours     of the onset of symptoms does not rule out     myocardial infarction with certainty.     If myocardial infarction is still suspected,     repeat the test at appropriate intervals.  TROPONIN I     Status: None   Collection Time    05/07/13  6:25 AM      Result Value Ref Range   Troponin I <0.30  <0.30 ng/mL   Comment:            Due to the release kinetics of cTnI,     a negative result within the first hours     of the onset of symptoms does not rule out     myocardial infarction with certainty.     If myocardial infarction is still suspected,     repeat the test at appropriate intervals.  BASIC METABOLIC PANEL     Status: Abnormal   Collection Time    05/07/13  6:25 AM      Result Value Ref Range   Sodium 139  137 - 147 mEq/L   Potassium 3.6 (*) 3.7 - 5.3 mEq/L   Chloride 100  96 - 112 mEq/L   CO2 26  19 - 32 mEq/L   Glucose, Bld 157 (*) 70 - 99 mg/dL   BUN 15  6 - 23 mg/dL   Creatinine, Ser 1.01  0.50 - 1.35 mg/dL   Calcium 8.9  8.4 - 10.5 mg/dL   GFR calc non Af Amer 68 (*) >90 mL/min   GFR calc Af Amer 78 (*) >90 mL/min   Comment: (NOTE)     The eGFR has been calculated using the CKD EPI equation.     This calculation has not been validated in all clinical situations.     eGFR's persistently <90 mL/min signify possible Chronic Kidney     Disease.  CBC     Status: Abnormal   Collection Time    05/07/13  6:25 AM      Result Value Ref Range   WBC 8.6  4.0 - 10.5 K/uL   RBC 4.44  4.22 - 5.81 MIL/uL   Hemoglobin 13.5  13.0 - 17.0 g/dL   HCT 41.1  39.0 - 52.0 %    MCV 92.6  78.0 - 100.0 fL   MCH 30.4  26.0 - 34.0 pg   MCHC 32.8  30.0 - 36.0 g/dL   RDW 14.7  11.5 - 15.5 %   Platelets 146 (*) 150 - 400 K/uL  PRO B NATRIURETIC PEPTIDE     Status: Abnormal   Collection Time    05/07/13  6:25 AM      Result Value Ref Range   Pro B Natriuretic peptide (BNP) 2384.0 (*) 0 - 450 pg/mL    Dg Chest 2 View  05/06/2013   CLINICAL DATA:  COUGH SHORTNESS OF BREATH  EXAM: CHEST  2 VIEW  COMPARISON:  None.  FINDINGS: Low lung volumes. Cardiac silhouette is enlarged. Diffuse bilateral pulmonary opacities appreciated left greater than right. There is mild prominence of interstitial markings and areas of mild peribronchial cuffing. Increased density within the lung bases right greater than left. Blunting of the costophrenic angles. Mild osteoarthritic changes within the shoulders.  IMPRESSION: Interstitial findings likely reflecting pulmonary  edema  Areas of atelectasis versus asymmetric edema within the lung bases  Trace bilateral effusions.  A non edematous infiltrate particularly within the right lung base cannot be excluded. Continued surveillance evaluation recommended.   Electronically Signed   By: Margaree Mackintosh M.D.   On: 05/06/2013 13:12    Assessment/Plan 1 atrial fibrillation-the patient has a history of atrial fibrillation and is followed in [place] for this issue. He apparently was on Coumadin in the past but the decision was made to change to aspirin. This is most likely related to his history of subdural hematoma but I do not have records available. He certainly has multiple embolic risk factors and will need close followup with his cardiologist there. His heart rate is slow. I agree with holding his calcium blocker and beta blocker and following heart rate on telemetry. Further adjustments based on followup heart rate. TSH normal. Echocardiogram pending. Would obtain outside records concerning cardiac history. 2 acute heart failure-await echocardiogram to  quantify LV function. Agree with diuresis (change lasix to 40 mg BID). Follow renal function. 3 URI-agree with antibiotics as his dyspnea is most likely a combination of congestive heart failure and upper respiratory infection. 4 hypertension-blood pressure is elevated. Agree with increased dose of ACE inhibitor. If blood pressure increases further add non AV nodal blocking agent such as Norvasc. 5 diabetes mellitus-management per primary care. 6 hyperlipidemia-continue statin. 7 history of subdural hematoma  Kirk Ruths MD 05/07/2013, 7:45 AM

## 2013-05-07 NOTE — Progress Notes (Signed)
[facility] PROGRESS NOTE  Grant Peck NUU:[id number] DOB: [birth year] DOA: 05/06/2013  PCP: Dr. Linward Foster? In [place], [place]  Brief HPI: Grant Peck is a 80 y.o. male with a past medical history of atrial fibrillation not on anticoagulation, diabetes mellitus type II, hypertension, who denied any history of congestive heart failure who was in his usual state of health till about 2 days prior to admission, when he started developing shortness of breath, and cough. He was admitted with acute CHF with possible pneumonia. He is visiting from [place].  Past medical history:  Past Medical History  Diagnosis Date  . Abdominal aortic aneurysm   . Subdural hematoma   . Urinary incontinence   . Diabetes mellitus without complication   . Hypertension   . Hypercholesteremia   . TIA (transient ischemic attack)   . Arthritis   . Atrial fibrillation     Consultants: Cardiology  Procedures:  LE Venous Doppler 4/11 No DVT  2D ECHO Pending.  Antibiotics: Ceftriaxone 4/11--> Azithromycin 4/11-->  Subjective: Patient feels better this morning. Shortness of breath is better but persists. Denies chest pain. HR remained low all night but he remained asymptomatic.   Objective: Vital Signs  Filed Vitals:   05/06/13 1607 05/06/13 2145 05/07/13 0057 05/07/13 0509  BP: 170/70 177/61 153/66 171/60  Pulse: 84 42 39 37  Temp: 98.7 F (37.1 C) 98.2 F (36.8 C)  97.9 F (36.6 C)  TempSrc: Oral Oral  Oral  Resp: 18 24 24 24   Height: 5\' 7"  (1.702 m)     Weight:    93.8 kg (206 lb 12.7 oz)  SpO2: 96% 93% 96% 97%    Intake/Output Summary (Last 24 hours) at 05/07/13 0749 Last data filed at 05/06/13 2100  Gross per 24 hour  Intake    480 ml  Output    750 ml  Net   -270 ml   Filed Weights   05/06/13 1545 05/07/13 0509  Weight: 96.8 kg (213 lb 6.5 oz) 93.8 kg (206 lb 12.7 oz)    Intake/Output from previous day: 04/11 0701 - 04/12 0700 In: 480 [P.O.:480] Out: 750  [Urine:750]  General appearance: alert, cooperative, appears stated age, no distress and moderately obese Resp: crackles bilaterally 1/3 lung fields. Not much change from yesterday. No wheezing. Cardio: S1S2 bradycardic, regular, No s3s4. No rubs bruits, murmurs. Pedal edema present 1+. GI: soft, non-tender; bowel sounds normal; no masses,  no organomegaly Extremities: edema 1+ Neurologic: Alert and oriented x 3. No focal deficits.  Lab Results:  Basic Metabolic Panel:  Recent Labs Lab 05/06/13 1345 05/06/13 1605 05/07/13 0625  NA 137  --  139  K 4.1  --  3.6*  CL 100  --  100  CO2 21  --  26  GLUCOSE 185*  --  157*  BUN 13  --  15  CREATININE 0.92  --  1.01  CALCIUM 9.3  --  8.9  MG  --  1.6  --    Liver Function Tests:  Recent Labs Lab 05/06/13 1345  AST 45*  ALT 49  ALKPHOS 107  BILITOT 1.2  PROT 7.2  ALBUMIN 3.5   CBC:  Recent Labs Lab 05/06/13 1345 05/07/13 0625  WBC 10.3 8.6  NEUTROABS 8.3*  --   HGB 14.4 13.5  HCT 42.3 41.1  MCV 91.4 92.6  PLT 147* 146*   Cardiac Enzymes:  Recent Labs Lab 05/06/13 1605 05/06/13 2145 05/07/13 0625  TROPONINI <0.30 <0.30 <0.30  BNP (last 3 results)  Recent Labs  05/06/13 1345 05/07/13 0625  PROBNP 4582.0* 2384.0*   CBG:  Recent Labs Lab 05/06/13 1713 05/06/13 2141  GLUCAP 175* 155*    No results found for this or any previous visit (from the past 240 hour(s)).    Studies/Results: Dg Chest 2 View  05/06/2013   CLINICAL DATA:  COUGH SHORTNESS OF BREATH  EXAM: CHEST  2 VIEW  COMPARISON:  None.  FINDINGS: Low lung volumes. Cardiac silhouette is enlarged. Diffuse bilateral pulmonary opacities appreciated left greater than right. There is mild prominence of interstitial markings and areas of mild peribronchial cuffing. Increased density within the lung bases right greater than left. Blunting of the costophrenic angles. Mild osteoarthritic changes within the shoulders.  IMPRESSION: Interstitial  findings likely reflecting pulmonary edema  Areas of atelectasis versus asymmetric edema within the lung bases  Trace bilateral effusions.  A non edematous infiltrate particularly within the right lung base cannot be excluded. Continued surveillance evaluation recommended.   Electronically Signed   By: Salome HolmesHector  Cooper M.D.   On: 05/06/2013 13:12    Medications:  Scheduled: . amantadine  100 mg Oral Daily  . aspirin  325 mg Oral QPM  . atorvastatin  40 mg Oral q1800  . azithromycin  500 mg Intravenous Q24H  . cefTRIAXone (ROCEPHIN)  IV  1 g Intravenous Q24H  . enoxaparin (LOVENOX) injection  40 mg Subcutaneous Q24H  . finasteride  5 mg Oral Daily  . furosemide  60 mg Intravenous BID  . insulin aspart  0-15 Units Subcutaneous TID WC  . lisinopril  40 mg Oral Daily  . pantoprazole  40 mg Oral Daily  . potassium chloride  40 mEq Oral Once  . sodium chloride  3 mL Intravenous Q12H   Continuous:  ZOX:[id number]:sodium chloride, hydrALAZINE, sodium chloride  Assessment/Plan:  Principal Problem:   Acute CHF (congestive heart failure) Active Problems:   CAP (community acquired pneumonia)   Bradycardia   Atrial fibrillation   DM type 2 (diabetes mellitus, type 2)   Acute respiratory failure with hypoxia    Acute respiratory failure with hypoxia This is most likely secondary to pulmonary edema/congestive heart failure. Seems better this morning though examination remains unchanged. Continue current treatment.   Acute CHF Await ECHO. EF unknown. Continue IV diuretics. Replete Potassium. Strict I/O. Daily weights. BP not well controlled. Will increase ACEI. Troponins are negative.  Bradycardia in setting of Atrial Fibrillation Possibly medication induced but patient is in CHF. Will consult cardiology. Monitor on Tele. Holding BB and CCB. Not on anticoagulation. Has history of subdural hematoma 2 years ago. TSH is normal.  Possible CAP Continue antibiotics for now. Repeat CXR in AM.   Diabetes  mellitus, type II Holding his metformin. Continue SSI. HbA1c is pending. CBG's stable.  Enlarged Right Leg No DVt noted on doppler study.   History of subdural hematoma 2 years ago No recent falls or injuries. No neurological deficits.   DVT Prophylaxis: Lovenox  Code Status: Full code  Family Communication: Discussed with patient.  Disposition Plan: Not ready for discharge. [place] He's currently staying at his son's place.     LOS: 1 day   Osvaldo ShipperGokul Holden Maniscalco  [facility] Pager [phone] 05/07/2013, 7:49 AM  If 8PM-8AM, please contact night-coverage at [email or web address], password [email or web address]

## 2013-05-07 NOTE — Progress Notes (Signed)
Pt was sustaining a HR of 38-40. Pt is asymptomatic and at rest. Did an EKG. Vitals are stable. Paged on call, no new orders received. Will continue to monitor.

## 2013-05-08 ENCOUNTER — Inpatient Hospital Stay (HOSPITAL_COMMUNITY): Payer: Medicare Other

## 2013-05-08 DIAGNOSIS — I5031 Acute diastolic (congestive) heart failure: Secondary | ICD-10-CM

## 2013-05-08 LAB — BASIC METABOLIC PANEL
BUN: 18 mg/dL (ref 6–23)
CO2: 27 meq/L (ref 19–32)
Calcium: 8.6 mg/dL (ref 8.4–10.5)
Chloride: 98 mEq/L (ref 96–112)
Creatinine, Ser: 0.97 mg/dL (ref 0.50–1.35)
GFR calc Af Amer: 87 mL/min — ABNORMAL LOW (ref >90)
GFR calc non Af Amer: 75 mL/min — ABNORMAL LOW (ref >90)
GLUCOSE: 165 mg/dL — AB (ref 70–99)
POTASSIUM: 3.3 meq/L — AB (ref 3.7–5.3)
SODIUM: 139 meq/L (ref 137–147)

## 2013-05-08 LAB — GLUCOSE, CAPILLARY
GLUCOSE-CAPILLARY: 175 mg/dL — AB (ref 70–99)
GLUCOSE-CAPILLARY: 165 mg/dL — AB (ref 70–99)
GLUCOSE-CAPILLARY: 188 mg/dL — AB (ref 70–99)
Glucose-Capillary: 168 mg/dL — ABNORMAL HIGH (ref 70–99)
Glucose-Capillary: 166 mg/dL — ABNORMAL HIGH (ref 70–99)

## 2013-05-08 LAB — CBC
HCT: 42.1 % (ref 39.0–52.0)
HEMOGLOBIN: 14.2 g/dL (ref 13.0–17.0)
MCH: 31.1 pg (ref 26.0–34.0)
MCHC: 33.7 g/dL (ref 30.0–36.0)
MCV: 92.3 fL (ref 78.0–100.0)
Platelets: 159 10*3/uL (ref 150–400)
RBC: 4.56 MIL/uL (ref 4.22–5.81)
RDW: 14.8 % (ref 11.5–15.5)
WBC: 8.6 10*3/uL (ref 4.0–10.5)

## 2013-05-08 MED ORDER — AMLODIPINE BESYLATE 2.5 MG PO TABS
2.5000 mg | ORAL_TABLET | Freq: Every day | ORAL | Status: DC
  Administered 2013-05-08: 2.5 mg via ORAL
  Filled 2013-05-08 (×2): qty 1

## 2013-05-08 MED ORDER — AZITHROMYCIN 500 MG PO TABS
500.0000 mg | ORAL_TABLET | Freq: Every day | ORAL | Status: DC
  Administered 2013-05-08 – 2013-05-12 (×5): 500 mg via ORAL
  Filled 2013-05-08 (×5): qty 1

## 2013-05-08 MED ORDER — CEFPODOXIME PROXETIL 200 MG PO TABS
200.0000 mg | ORAL_TABLET | Freq: Two times a day (BID) | ORAL | Status: DC
  Administered 2013-05-08 – 2013-05-10 (×5): 200 mg via ORAL
  Filled 2013-05-08 (×6): qty 1

## 2013-05-08 MED ORDER — ALBUTEROL SULFATE (2.5 MG/3ML) 0.083% IN NEBU
2.5000 mg | INHALATION_SOLUTION | RESPIRATORY_TRACT | Status: DC | PRN
  Administered 2013-05-08 – 2013-05-10 (×4): 2.5 mg via RESPIRATORY_TRACT
  Filled 2013-05-08 (×4): qty 3

## 2013-05-08 MED ORDER — POTASSIUM CHLORIDE CRYS ER 20 MEQ PO TBCR
40.0000 meq | EXTENDED_RELEASE_TABLET | Freq: Two times a day (BID) | ORAL | Status: DC
  Administered 2013-05-08 – 2013-05-12 (×9): 40 meq via ORAL
  Filled 2013-05-08 (×11): qty 2

## 2013-05-08 NOTE — Plan of Care (Signed)
Problem: Phase II Progression Outcomes Goal: Begin discharge teaching Outcome: Completed/Met Date Met:  05/08/13 HF Zone Tool given and discussed

## 2013-05-08 NOTE — Progress Notes (Signed)
[facility] PROGRESS NOTE  Daquawn Seelman ZOX:[id number] DOB: 11/21/1930 DOA: 05/06/2013  PCP: Dr. Linward Foster? In [place], [place]  Brief HPI: Grant Peck is a 80 y.o. male with a past medical history of atrial fibrillation not on anticoagulation, diabetes mellitus type II, hypertension, who denied any history of congestive heart failure who was in his usual state of health till about 2 days prior to admission, when he started developing shortness of breath, and cough. He was admitted with acute CHF with possible pneumonia. He is visiting from [place].  Past medical history:  Past Medical History  Diagnosis Date  . Abdominal aortic aneurysm   . Subdural hematoma   . Urinary incontinence   . Diabetes mellitus without complication   . Hypertension   . Hypercholesteremia   . TIA (transient ischemic attack)   . Arthritis   . Atrial fibrillation     Consultants: Cardiology  Procedures:  LE Venous Doppler 4/11 No DVT  2D ECHO 4/12 Left ventricle: The cavity size was normal. Systolic function was vigorous. The estimated ejection fraction was in the range of 65% to 70%. Wall motion was normal; there were no regional wall motion abnormalities. - Aortic valve: Trileaflet; mildly thickened leaflets. Moderate regurgitation. Valve area: 3.26cm^2 (Vmax). - Aortic root: The aortic root was normal in size. - Mitral valve: No regurgitation. - Left atrium: The atrium was moderately dilated. - Right ventricle: Systolic function was normal. - Right atrium: The atrium was normal in size. - Tricuspid valve: No regurgitation. - Pulmonary arteries: Systolic pressure was within the normal range.  Antibiotics: Ceftriaxone 4/11--> Azithromycin 4/11-->  Subjective: Patient feels much better. Still has a cough. Was wheezing earlier today. Denies chest pain. Denies lightheadedness.  Objective: Vital Signs  Filed Vitals:   05/07/13 2225 05/08/13 0456 05/08/13 0548 05/08/13 0550  BP: 150/72   149/98   Pulse: 41  40   Temp: 98.8 F (37.1 C)  98.4 F (36.9 C)   TempSrc: Oral  Oral   Resp: 28  28   Height:      Weight:   91.9 kg (202 lb 9.6 oz)   SpO2: 97% 98% 95% 92%    Intake/Output Summary (Last 24 hours) at 05/08/13 0803 Last data filed at 05/08/13 0548  Gross per 24 hour  Intake    540 ml  Output   3625 ml  Net  -3085 ml   Filed Weights   05/06/13 1545 05/07/13 0509 05/08/13 0548  Weight: 96.8 kg (213 lb 6.5 oz) 93.8 kg (206 lb 12.7 oz) 91.9 kg (202 lb 9.6 oz)    General appearance: alert, cooperative, appears stated age, no distress and moderately obese Resp: crackles bilaterally 1/3 lung fields perhaps slightly less. Slightly improved air entry. No wheezing. Cardio: S1S2 bradycardic, regular, No s3s4. No rubs bruits, murmurs. Pedal edema improved.  GI: soft, non-tender; bowel sounds normal; no masses,  no organomegaly Neurologic: Alert and oriented x 3. No focal deficits.  Lab Results:  Basic Metabolic Panel:  Recent Labs Lab 05/06/13 1345 05/06/13 1605 05/07/13 0625 05/08/13 0333  NA 137  --  139 139  K 4.1  --  3.6* 3.3*  CL 100  --  100 98  CO2 21  --  26 27  GLUCOSE 185*  --  157* 165*  BUN 13  --  15 18  CREATININE 0.92  --  1.01 0.97  CALCIUM 9.3  --  8.9 8.6  MG  --  1.6  --   --  Liver Function Tests:  Recent Labs Lab 05/06/13 1345  AST 45*  ALT 49  ALKPHOS 107  BILITOT 1.2  PROT 7.2  ALBUMIN 3.5   CBC:  Recent Labs Lab 05/06/13 1345 05/07/13 0625 05/08/13 0333  WBC 10.3 8.6 8.6  NEUTROABS 8.3*  --   --   HGB 14.4 13.5 14.2  HCT 42.3 41.1 42.1  MCV 91.4 92.6 92.3  PLT 147* 146* 159   Cardiac Enzymes:  Recent Labs Lab 05/06/13 1605 05/06/13 2145 05/07/13 0625  TROPONINI <0.30 <0.30 <0.30   BNP (last 3 results)  Recent Labs  05/06/13 1345 05/07/13 0625  PROBNP 4582.0* 2384.0*   CBG:  Recent Labs Lab 05/07/13 0751 05/07/13 1222 05/07/13 1640 05/07/13 2222 05/08/13 0739  GLUCAP 157* 157* 215*  175* 168*    Recent Results (from the past 240 hour(s))  CULTURE, BLOOD (ROUTINE X 2)     Status: None   Collection Time    05/06/13  1:45 PM      Result Value Ref Range Status   Specimen Description BLOOD RIGHT ANTECUBITAL   Final   Special Requests BOTTLES DRAWN AEROBIC AND ANAEROBIC 4CC EACH   Final   Culture  Setup Time     Final   Value: 05/06/2013 17:24     Performed at [facility]   Culture     Final   Value:        BLOOD CULTURE RECEIVED NO GROWTH TO DATE CULTURE WILL BE HELD FOR 5 DAYS BEFORE ISSUING A FINAL NEGATIVE REPORT     Performed at [facility]   Report Status PENDING   Incomplete  CULTURE, BLOOD (ROUTINE X 2)     Status: None   Collection Time    05/06/13  2:00 PM      Result Value Ref Range Status   Specimen Description BLOOD LEFT ARM  5 [facility]   Final   Special Requests Normal   Final   Culture  Setup Time     Final   Value: 05/06/2013 17:24     Performed at [facility]   Culture     Final   Value:        BLOOD CULTURE RECEIVED NO GROWTH TO DATE CULTURE WILL BE HELD FOR 5 DAYS BEFORE ISSUING A FINAL NEGATIVE REPORT     Performed at [facility]   Report Status PENDING   Incomplete      Studies/Results: Dg Chest 2 View  05/06/2013   CLINICAL DATA:  COUGH SHORTNESS OF BREATH  EXAM: CHEST  2 VIEW  COMPARISON:  None.  FINDINGS: Low lung volumes. Cardiac silhouette is enlarged. Diffuse bilateral pulmonary opacities appreciated left greater than right. There is mild prominence of interstitial markings and areas of mild peribronchial cuffing. Increased density within the lung bases right greater than left. Blunting of the costophrenic angles. Mild osteoarthritic changes within the shoulders.  IMPRESSION: Interstitial findings likely reflecting pulmonary edema  Areas of atelectasis versus asymmetric edema within the lung bases  Trace bilateral effusions.  A non edematous infiltrate particularly within the right lung base  cannot be excluded. Continued surveillance evaluation recommended.   Electronically Signed   By: Salome HolmesHector  Cooper M.D.   On: 05/06/2013 13:12    Medications:  Scheduled: . amantadine  100 mg Oral Daily  . amLODipine  2.5 mg Oral Daily  . aspirin  325 mg Oral QPM  . atorvastatin  40 mg Oral q1800  .  azithromycin  500 mg Oral Daily  . cefpodoxime  200 mg Oral Q12H  . enoxaparin (LOVENOX) injection  40 mg Subcutaneous Q24H  . finasteride  5 mg Oral Daily  . furosemide  40 mg Intravenous BID  . insulin aspart  0-15 Units Subcutaneous TID WC  . lisinopril  40 mg Oral Daily  . pantoprazole  40 mg Oral Daily  . potassium chloride  40 mEq Oral BID  . sodium chloride  3 mL Intravenous Q12H   Continuous:  ZOX:[id number]:sodium chloride, albuterol, hydrALAZINE, sodium chloride  Assessment/Plan:  Principal Problem:   Acute CHF (congestive heart failure) Active Problems:   CAP (community acquired pneumonia)   Bradycardia   Atrial fibrillation   DM type 2 (diabetes mellitus, type 2)   Acute respiratory failure with hypoxia    Acute respiratory failure with hypoxia Much improved. This was most likely secondary to pulmonary edema/congestive heart failure. Check RA sats.   Acute CHF ECHO report reviewed. Normal EF and no mention of diastolic HF. Bradycardia could be contributing. Diuresing well. Continue IV diuretics. Replete Potassium. Strict I/O. Daily weights. BP not well controlled. Increased ACEI 4/12 and Amlodipine added by cards today. Troponins are negative.  Bradycardia in setting of Atrial Fibrillation Possibly medication induced but patient is in CHF. Cardiology is following. Continue Tele. Holding BB and CCB. Not on anticoagulation. Has history of subdural hematoma 2 years ago. TSH is normal.  Possible CAP Continue antibiotics for now but change to oral. Repeat CXR pending.  Diabetes mellitus, type II Holding his metformin. Continue SSI. HbA1c is pending. CBG's stable.  Enlarged  Right Leg No DVT noted on doppler study.   History of subdural hematoma 2 years ago No recent falls or injuries. No neurological deficits.   DVT Prophylaxis: Lovenox  Code Status: Full code  Family Communication: Discussed with patient.  Disposition Plan: Not ready for discharge. [place] is originally from [place] He's currently staying at his son's place.     LOS: 2 days   Osvaldo ShipperGokul Beatrice Sehgal  [facility] Pager [phone] 05/08/2013, 8:03 AM  If 8PM-8AM, please contact night-coverage at [email or web address], password [email or web address]

## 2013-05-08 NOTE — Progress Notes (Signed)
  SATURATION QUALIFICATIONS: (This note is used to comply with regulatory documentation for home oxygen)  Patient Saturations on Room Air at Rest = 93%  Patient Saturations on Room Air while Ambulating = 86%  Patient Saturations on 2 Liters of oxygen when returned to room = 98% Grant Peck

## 2013-05-08 NOTE — Care Management Note (Signed)
Chart reviewed for continued stay. Pt oxygen saturation documented by RN qualifies pt for home oxygen therapy. Documentation only valid for 24 hrs. Cm to continue to follow for possible hh needs. Pt plans to return to [place].Currently visiting son.    Roxy Mannsymeeka Korah Hufstedler,MSN,RN [phone]

## 2013-05-08 NOTE — Progress Notes (Signed)
TELEMETRY: Reviewed telemetry pt in Atrial fibrillation with slow ventricular response 40 bpm:  Filed Vitals:   05/07/13 2225 05/08/13 0456 05/08/13 0548 05/08/13 0550  BP: 150/72  149/98   Pulse: 41  40   Temp: 98.8 F (37.1 C)  98.4 F (36.9 C)   TempSrc: Oral  Oral   Resp: 28  28   Height:      Weight:   202 lb 9.6 oz (91.9 kg)   SpO2: 97% 98% 95% 92%    Intake/Output Summary (Last 24 hours) at 05/08/13 0729 Last data filed at 05/08/13 0548  Gross per 24 hour  Intake    540 ml  Output   3625 ml  Net  -3085 ml    SUBJECTIVE Feels better today. Still coughing. Felt better after breathing Rx. No chest pain.  LABS: Basic Metabolic Panel:  Recent Labs  78/29/5602/12/11 1345 05/06/13 1605 05/07/13 0625 05/08/13 0333  NA 137  --  139 139  K 4.1  --  3.6* 3.3*  CL 100  --  100 98  CO2 21  --  26 27  GLUCOSE 185*  --  157* 165*  BUN 13  --  15 18  CREATININE 0.92  --  1.01 0.97  CALCIUM 9.3  --  8.9 8.6  MG  --  1.6  --   --    Liver Function Tests:  Recent Labs  05/06/13 1345  AST 45*  ALT 49  ALKPHOS 107  BILITOT 1.2  PROT 7.2  ALBUMIN 3.5   No results found for this basename: LIPASE, AMYLASE,  in the last 72 hours CBC:  Recent Labs  05/06/13 1345 05/07/13 0625 05/08/13 0333  WBC 10.3 8.6 8.6  NEUTROABS 8.3*  --   --   HGB 14.4 13.5 14.2  HCT 42.3 41.1 42.1  MCV 91.4 92.6 92.3  PLT 147* 146* 159   Cardiac Enzymes:  Recent Labs  05/06/13 1605 05/06/13 2145 05/07/13 0625  TROPONINI <0.30 <0.30 <0.30   BNP: 2384  Hemoglobin A1C:  Recent Labs  05/07/13 0625  HGBA1C 8.0*   Fasting Lipid Panel: No results found for this basename: CHOL, HDL, LDLCALC, TRIG, CHOLHDL, LDLDIRECT,  in the last 72 hours Thyroid Function Tests:  Recent Labs  05/06/13 1605  TSH 1.360    Radiology/Studies:  Dg Chest 2 View  05/06/2013   CLINICAL DATA:  COUGH SHORTNESS OF BREATH  EXAM: CHEST  2 VIEW  COMPARISON:  None.  FINDINGS: Low lung volumes. Cardiac  silhouette is enlarged. Diffuse bilateral pulmonary opacities appreciated left greater than right. There is mild prominence of interstitial markings and areas of mild peribronchial cuffing. Increased density within the lung bases right greater than left. Blunting of the costophrenic angles. Mild osteoarthritic changes within the shoulders.  IMPRESSION: Interstitial findings likely reflecting pulmonary edema  Areas of atelectasis versus asymmetric edema within the lung bases  Trace bilateral effusions.  A non edematous infiltrate particularly within the right lung base cannot be excluded. Continued surveillance evaluation recommended.   Electronically Signed   By: Salome HolmesHector  Cooper M.D.   On: 05/06/2013 13:12   Ecg: atrial fibrillation with slow ventricular response/ junctional escape. RBBB.  Echo: Study Conclusions  - Left ventricle: The cavity size was normal. Systolic function was vigorous. The estimated ejection fraction was in the range of 65% to 70%. Wall motion was normal; there were no regional wall motion abnormalities. - Aortic valve: Trileaflet; mildly thickened leaflets. Moderate regurgitation. Valve area: 3.26cm^2 (  Vmax). - Aortic root: The aortic root was normal in size. - Mitral valve: No regurgitation. - Left atrium: The atrium was moderately dilated. - Right ventricle: Systolic function was normal. - Right atrium: The atrium was normal in size. - Tricuspid valve: No regurgitation. - Pulmonary arteries: Systolic pressure was within the normal range.   PHYSICAL EXAM General: Well developed, well nourished, in no acute distress. Head: Normocephalic, atraumatic, sclera non-icteric, no xanthomas, nares are without discharge. Neck: Negative for carotid bruits. JVD not elevated. Lungs: Bibasilar rales. Heart: IRRR, bradycardic, S1 S2 without murmurs, rubs, or gallops.  Abdomen: Soft, obese, non-tender, non-distended with normoactive bowel sounds. No hepatomegaly. No  rebound/guarding. No obvious abdominal masses. Msk:  Strength and tone appears normal for age. Extremities: No clubbing, cyanosis or edema.  Distal pedal pulses are 2+ and equal bilaterally. Neuro: Alert and oriented X 3. Moves all extremities spontaneously. Psych:  Responds to questions appropriately with a normal affect.  ASSESSMENT AND PLAN: 1. Acute on chronic diastolic CHF. Excellent diuresis. I/O negative 3 liters. Weight down 4 lbs ( 11 lbs since admit ). Normal LV function by Echo. Continue IV diuresis. Continue ARB. Not a candidate for beta blocker due to bradycardia. 2. Atrial fibrillation with slow ventricular response. Metoprolol and cardizem held. If no improvement in HR over the next 24-48 hrs may need to consider for permanent pacemaker. 3. Moderate aortic insufficiency. 4. Hypokalemia- replete 5. URI/ acute bronchitis. On antibiotics and breathing Rx.  6. HTN. Will add low dose Norvasc. 7. History of SDH. Presumably this is why he is not on coumadin.  8. Hyperlipidemia. On statin. 9. DM type 2  Principal Problem:   Acute CHF (congestive heart failure) Active Problems:   CAP (community acquired pneumonia)   Bradycardia   Atrial fibrillation   DM type 2 (diabetes mellitus, type 2)   Acute respiratory failure with hypoxia    Signed, Semya Klinke M [place] MD,FAACC 05/08/2013 7:41 AM

## 2013-05-09 ENCOUNTER — Encounter (HOSPITAL_COMMUNITY): Payer: Self-pay | Admitting: *Deleted

## 2013-05-09 LAB — CBC
HCT: 45.9 % (ref 39.0–52.0)
Hemoglobin: 15.2 g/dL (ref 13.0–17.0)
MCH: 30.8 pg (ref 26.0–34.0)
MCHC: 33.1 g/dL (ref 30.0–36.0)
MCV: 92.9 fL (ref 78.0–100.0)
PLATELETS: 184 10*3/uL (ref 150–400)
RBC: 4.94 MIL/uL (ref 4.22–5.81)
RDW: 14.9 % (ref 11.5–15.5)
WBC: 7.8 10*3/uL (ref 4.0–10.5)

## 2013-05-09 LAB — GLUCOSE, CAPILLARY
GLUCOSE-CAPILLARY: 149 mg/dL — AB (ref 70–99)
GLUCOSE-CAPILLARY: 183 mg/dL — AB (ref 70–99)
Glucose-Capillary: 186 mg/dL — ABNORMAL HIGH (ref 70–99)
Glucose-Capillary: 226 mg/dL — ABNORMAL HIGH (ref 70–99)

## 2013-05-09 LAB — BASIC METABOLIC PANEL
BUN: 19 mg/dL (ref 6–23)
CHLORIDE: 98 meq/L (ref 96–112)
CO2: 30 mEq/L (ref 19–32)
CREATININE: 1.01 mg/dL (ref 0.50–1.35)
Calcium: 9.2 mg/dL (ref 8.4–10.5)
GFR calc Af Amer: 78 mL/min — ABNORMAL LOW (ref >90)
GFR calc non Af Amer: 68 mL/min — ABNORMAL LOW (ref >90)
Glucose, Bld: 152 mg/dL — ABNORMAL HIGH (ref 70–99)
Potassium: 3.7 mEq/L (ref 3.7–5.3)
SODIUM: 142 meq/L (ref 137–147)

## 2013-05-09 MED ORDER — FUROSEMIDE 40 MG PO TABS
40.0000 mg | ORAL_TABLET | Freq: Two times a day (BID) | ORAL | Status: DC
  Administered 2013-05-09 – 2013-05-12 (×7): 40 mg via ORAL
  Filled 2013-05-09 (×10): qty 1

## 2013-05-09 MED ORDER — AMLODIPINE BESYLATE 5 MG PO TABS
5.0000 mg | ORAL_TABLET | Freq: Every day | ORAL | Status: DC
  Administered 2013-05-09 – 2013-05-10 (×2): 5 mg via ORAL
  Filled 2013-05-09 (×3): qty 1

## 2013-05-09 NOTE — Progress Notes (Signed)
TELEMETRY: Reviewed telemetry pt in Atrial fibrillation with slow ventricular response 40 bpm, his heart rate increases at times into the 60s.Ceasar Mons:  Filed Vitals:   05/08/13 1220 05/08/13 1342 05/08/13 2100 05/09/13 0500  BP:  147/56 167/66 167/60  Pulse:  47 41 70  Temp:  99 F (37.2 C) 98.2 F (36.8 C) 98.8 F (37.1 C)  TempSrc:  Oral Oral Oral  Resp:  21 20 20   Height:      Weight:    201 lb 8 oz (91.4 kg)  SpO2: 86% 95% 92% 94%    Intake/Output Summary (Last 24 hours) at 05/09/13 0743 Last data filed at 05/08/13 2100  Gross per 24 hour  Intake    723 ml  Output   1926 ml  Net  -1203 ml    SUBJECTIVE Feels better today. Cough improved. Less SOB. No chest pain.  LABS: Basic Metabolic Panel:  Recent Labs  16/10/9602/11/15 1345 05/06/13 1605  05/08/13 0333 05/09/13 0401  NA 137  --   < > 139 142  K 4.1  --   < > 3.3* 3.7  CL 100  --   < > 98 98  CO2 21  --   < > 27 30  GLUCOSE 185*  --   < > 165* 152*  BUN 13  --   < > 18 19  CREATININE 0.92  --   < > 0.97 1.01  CALCIUM 9.3  --   < > 8.6 9.2  MG  --  1.6  --   --   --   < > = values in this interval not displayed. Liver Function Tests:  Recent Labs  05/06/13 1345  AST 45*  ALT 49  ALKPHOS 107  BILITOT 1.2  PROT 7.2  ALBUMIN 3.5   No results found for this basename: LIPASE, AMYLASE,  in the last 72 hours CBC:  Recent Labs  05/06/13 1345  05/08/13 0333 05/09/13 0401  WBC 10.3  < > 8.6 7.8  NEUTROABS 8.3*  --   --   --   HGB 14.4  < > 14.2 15.2  HCT 42.3  < > 42.1 45.9  MCV 91.4  < > 92.3 92.9  PLT 147*  < > 159 184  < > = values in this interval not displayed. Cardiac Enzymes:  Recent Labs  05/06/13 1605 05/06/13 2145 05/07/13 0625  TROPONINI <0.30 <0.30 <0.30   BNP: 2384  Hemoglobin A1C:  Recent Labs  05/07/13 0625  HGBA1C 8.0*   Fasting Lipid Panel: No results found for this basename: CHOL, HDL, LDLCALC, TRIG, CHOLHDL, LDLDIRECT,  in the last 72 hours Thyroid Function  Tests:  Recent Labs  05/06/13 1605  TSH 1.360    Radiology/Studies:  Dg Chest 2 View  05/06/2013   CLINICAL DATA:  COUGH SHORTNESS OF BREATH  EXAM: CHEST  2 VIEW  COMPARISON:  None.  FINDINGS: Low lung volumes. Cardiac silhouette is enlarged. Diffuse bilateral pulmonary opacities appreciated left greater than right. There is mild prominence of interstitial markings and areas of mild peribronchial cuffing. Increased density within the lung bases right greater than left. Blunting of the costophrenic angles. Mild osteoarthritic changes within the shoulders.  IMPRESSION: Interstitial findings likely reflecting pulmonary edema  Areas of atelectasis versus asymmetric edema within the lung bases  Trace bilateral effusions.  A non edematous infiltrate particularly within the right lung base cannot be excluded. Continued surveillance evaluation recommended.   Electronically Signed   By: Oswaldo DoneHector  Excell Seltzerooper M.D.   On: 05/06/2013 13:12   Ecg: atrial fibrillation with slow ventricular response/ junctional escape. RBBB.  Echo: Study Conclusions  - Left ventricle: The cavity size was normal. Systolic function was vigorous. The estimated ejection fraction was in the range of 65% to 70%. Wall motion was normal; there were no regional wall motion abnormalities. - Aortic valve: Trileaflet; mildly thickened leaflets. Moderate regurgitation. Valve area: 3.26cm^2 (Vmax). - Aortic root: The aortic root was normal in size. - Mitral valve: No regurgitation. - Left atrium: The atrium was moderately dilated. - Right ventricle: Systolic function was normal. - Right atrium: The atrium was normal in size. - Tricuspid valve: No regurgitation. - Pulmonary arteries: Systolic pressure was within the normal range.   PHYSICAL EXAM General: Well developed, well nourished, in no acute distress. Head: Normocephalic, atraumatic, sclera non-icteric, no xanthomas, nares are without discharge. Neck: Negative for carotid bruits.  JVD not elevated. Lungs: mild left basilar rales. Improved. Heart: IRRR, bradycardic, S1 S2 without murmurs, rubs, or gallops.  Abdomen: Soft, obese, non-tender, non-distended with normoactive bowel sounds. No hepatomegaly. No rebound/guarding. No obvious abdominal masses. Msk:  Strength and tone appears normal for age. Extremities: No clubbing, cyanosis or edema.  Distal pedal pulses are 2+ and equal bilaterally. Neuro: Alert and oriented X 3. Moves all extremities spontaneously. Psych:  Responds to questions appropriately with a normal affect.  ASSESSMENT AND PLAN: 1. Acute on chronic diastolic CHF. Excellent diuresis. I/O negative. Weight down ( 11 lbs since admit ). Normal LV function by Echo. Will switch lasix to po today. Continue ARB. Not a candidate for beta blocker due to bradycardia. 2. Atrial fibrillation with slow ventricular response. Metoprolol and cardizem held. HR improved but still some periods of junctional bradycardia. Hopefully this will continue to improved with holding meds and tuning up CHF. No indication for pacemaker at this point.  3. Moderate aortic insufficiency. 4. Hypokalemia- repleted 5. URI/ acute bronchitis. On antibiotics and breathing Rx.  6. HTN. Will increase Norvasc to 5 mg daily. 7. History of SDH. Presumably this is why he is not on coumadin.  8. Hyperlipidemia. On statin. 9. DM type 2  Principal Problem:   Acute CHF (congestive heart failure) Active Problems:   CAP (community acquired pneumonia)   Bradycardia   Atrial fibrillation   DM type 2 (diabetes mellitus, type 2)   Acute respiratory failure with hypoxia    Signed, Charmeka Freeburg M [place] MD,FAACC 05/09/2013 7:43 AM

## 2013-05-09 NOTE — Progress Notes (Signed)
[facility] PROGRESS NOTE  Grant LyeDonald Middlebrooks WUJ:[id number] DOB: 03/12/1930 DOA: 05/06/2013  PCP: Dr. Linward FosterGunning? [place]  Brief HPI: Grant Peck is a 80 y.o. male with a past medical history of atrial fibrillation not on anticoagulation, diabetes mellitus type II, hypertension, who denied any history of congestive heart failure who was in his usual state of health till about 2 days prior to admission, when he started developing shortness of breath, and cough. He was admitted with acute CHF with possible pneumonia. [place].  Past medical history:  Past Medical History  Diagnosis Date  . Abdominal aortic aneurysm   . Subdural hematoma   . Urinary incontinence   . Diabetes mellitus without complication   . Hypertension   . Hypercholesteremia   . TIA (transient ischemic attack)   . Arthritis   . Atrial fibrillation     Consultants: Cardiology  Procedures:  LE Venous Doppler 4/11 No DVT  2D ECHO 4/12 Left ventricle: The cavity size was normal. Systolic function was vigorous. The estimated ejection fraction was in the range of 65% to 70%. Wall motion was normal; there were no regional wall motion abnormalities. - Aortic valve: Trileaflet; mildly thickened leaflets. Moderate regurgitation. Valve area: 3.26cm^2 (Vmax). - Aortic root: The aortic root was normal in size. - Mitral valve: No regurgitation. - Left atrium: The atrium was moderately dilated. - Right ventricle: Systolic function was normal. - Right atrium: The atrium was normal in size. - Tricuspid valve: No regurgitation. - Pulmonary arteries: Systolic pressure was within the normal range.  Antibiotics: Ceftriaxone 4/11-->4/13 Azithromycin 4/11--> Vantin 4/13-->  Subjective: Patient continues to feel better. Breathing is much better. Denies chest pain. Denies lightheadedness.  Objective: Vital Signs  Filed Vitals:   05/08/13 1220 05/08/13 1342 05/08/13 2100 05/09/13 0500  BP:   147/56 167/66 167/60  Pulse:  47 41 70  Temp:  99 F (37.2 C) 98.2 F (36.8 C) 98.8 F (37.1 C)  TempSrc:  Oral Oral Oral  Resp:  21 20 20   Height:      Weight:    91.4 kg (201 lb 8 oz)  SpO2: 86% 95% 92% 94%    Intake/Output Summary (Last 24 hours) at 05/09/13 0750 Last data filed at 05/08/13 2100  Gross per 24 hour  Intake    723 ml  Output   1926 ml  Net  -1203 ml   Filed Weights   05/07/13 0509 05/08/13 0548 05/09/13 0500  Weight: 93.8 kg (206 lb 12.7 oz) 91.9 kg (202 lb 9.6 oz) 91.4 kg (201 lb 8 oz)    General appearance: alert, cooperative, appears stated age, no distress and moderately obese Resp: crackles bilaterally but less than yesterday. Improved air entry. No wheezing. Cardio: S1S2 bradycardic, regular, No s3s4. No rubs bruits, murmurs. Pedal edema improved.  GI: soft, non-tender; bowel sounds normal; no masses,  no organomegaly Neurologic: Alert and oriented x 3. No focal deficits.  Lab Results:  Basic Metabolic Panel:  Recent Labs Lab 05/06/13 1345 05/06/13 1605 05/07/13 0625 05/08/13 0333 05/09/13 0401  NA 137  --  139 139 142  K 4.1  --  3.6* 3.3* 3.7  CL 100  --  100 98 98  CO2 21  --  26 27 30   GLUCOSE 185*  --  157* 165* 152*  BUN 13  --  15 18 19   CREATININE 0.92  --  1.01 0.97 1.01  CALCIUM 9.3  --  8.9 8.6 9.2  MG  --  1.6  --   --   --    Liver Function Tests:  Recent Labs Lab 05/06/13 1345  AST 45*  ALT 49  ALKPHOS 107  BILITOT 1.2  PROT 7.2  ALBUMIN 3.5   CBC:  Recent Labs Lab 05/06/13 1345 05/07/13 0625 05/08/13 0333 05/09/13 0401  WBC 10.3 8.6 8.6 7.8  NEUTROABS 8.3*  --   --   --   HGB 14.4 13.5 14.2 15.2  HCT 42.3 41.1 42.1 45.9  MCV 91.4 92.6 92.3 92.9  PLT 147* 146* 159 184   Cardiac Enzymes:  Recent Labs Lab 05/06/13 1605 05/06/13 2145 05/07/13 0625  TROPONINI <0.30 <0.30 <0.30   BNP (last 3 results)  Recent Labs  05/06/13 1345 05/07/13 0625  PROBNP 4582.0* 2384.0*   CBG:  Recent  Labs Lab 05/08/13 0739 05/08/13 1208 05/08/13 1613 05/08/13 2137 05/09/13 0731  GLUCAP 168* 166* 165* 188* 149*    Recent Results (from the past 240 hour(s))  CULTURE, BLOOD (ROUTINE X 2)     Status: None   Collection Time    05/06/13  1:45 PM      Result Value Ref Range Status   Specimen Description BLOOD RIGHT ANTECUBITAL   Final   Special Requests BOTTLES DRAWN AEROBIC AND ANAEROBIC 4CC EACH   Final   Culture  Setup Time     Final   Value: 05/06/2013 17:24     Performed at [facility]   Culture     Final   Value:        BLOOD CULTURE RECEIVED NO GROWTH TO DATE CULTURE WILL BE HELD FOR 5 DAYS BEFORE ISSUING A FINAL NEGATIVE REPORT     Performed at [facility]   Report Status PENDING   Incomplete  CULTURE, BLOOD (ROUTINE X 2)     Status: None   Collection Time    05/06/13  2:00 PM      Result Value Ref Range Status   Specimen Description BLOOD LEFT ARM  [facility]   Final   Special Requests Normal   Final   Culture  Setup Time     Final   Value: 05/06/2013 17:24     Performed at [facility]   Culture     Final   Value:        BLOOD CULTURE RECEIVED NO GROWTH TO DATE CULTURE WILL BE HELD FOR 5 DAYS BEFORE ISSUING A FINAL NEGATIVE REPORT     Performed at [facility]   Report Status PENDING   Incomplete      Studies/Results: Dg Chest 2 View  05/08/2013   CLINICAL DATA:  Hypertension, diabetes, CHF  EXAM: CHEST  2 VIEW  COMPARISON:  05/06/2013  FINDINGS: Persistent cardiomegaly with central vascular congestion. Similar small effusions, best appreciated on the lateral view. Similar mild interstitial opacities in the right lower lobe and left upper lobe as before, compatible with asymmetric edema, less likely pneumonia. Trachea is midline. Atherosclerosis of the aorta. Rib deformities on the right with pleural thickening, suspect remote trauma.  IMPRESSION: Cardiomegaly with right lower lobe and left upper lobe mild interstitial edema  pattern.   Electronically Signed   By: Ruel Favorsrevor  Shick M.D.   On: 05/08/2013 08:37    Medications:  Scheduled: . amantadine  100 mg Oral Daily  . amLODipine  5 mg Oral Daily  . aspirin  325 mg Oral QPM  . atorvastatin  40 mg  Oral q1800  . azithromycin  500 mg Oral Daily  . cefpodoxime  200 mg Oral Q12H  . enoxaparin (LOVENOX) injection  40 mg Subcutaneous Q24H  . finasteride  5 mg Oral Daily  . furosemide  40 mg Oral BID  . insulin aspart  0-15 Units Subcutaneous TID WC  . lisinopril  40 mg Oral Daily  . pantoprazole  40 mg Oral Daily  . potassium chloride  40 mEq Oral BID  . sodium chloride  3 mL Intravenous Q12H   Continuous:  ZOX:WRUEAV chloride, albuterol, hydrALAZINE, sodium chloride  Assessment/Plan:  Principal Problem:   Acute CHF (congestive heart failure) Active Problems:   CAP (community acquired pneumonia)   Bradycardia   Atrial fibrillation   DM type 2 (diabetes mellitus, type 2)   Acute respiratory failure with hypoxia    Acute respiratory failure with hypoxia Much improved. This was most likely secondary to pulmonary edema/congestive heart failure. RA sats dropped with ambulation on 4/13 but normal at rest on RA. This will need to be repeated on 4/15. I think he might not need Home oxygen as his CHF is improving.   Acute CHF and Hypertension ECHO report reviewed. Normal EF and no mention of diastolic HF. Bradycardia could have contributed. He is diuresing well. Diuretics changed to oral by cards. On Potassium as well. Strict I/O. Daily weights. BP not well controlled. Increased ACEI 4/12 and Amlodipine added by cards 4/13. Dose increased today. Troponins are negative.  Bradycardia in setting of Atrial Fibrillation HR seems to be better but still low. Patient however is improving. This was possibly medication induced. Cardiology is following. Continue Tele. Holding BB and CCB. Not on anticoagulation. Has history of subdural hematoma 2 years ago. TSH is normal.  Since patient is improving clinically it appears he might not need a pacemaker at this time. He should follow up with his cardiologist in [place]. Patient will call to make appointment for early next week.   Possible CAP Continue oral antibiotics. Repeat CXR pending more suggestive of CHF but he was exhibiting signs of bronchitis. He should complete a 7 day course.  Diabetes mellitus, type II Holding his metformin. Continue SSI. HbA1c is 8.0. CBG's stable.  Enlarged Right Leg No DVT noted on doppler study.   History of subdural hematoma 2 years ago No recent falls or injuries. No neurological deficits.   DVT Prophylaxis: Lovenox  Code Status: Full code  Family Communication: Discussed with patient.  Disposition Plan: Anticipate discharge 4/15 if cleared by cardiology. Will need repeat RA sat check with ambulation prior to discharge. He is originally from [place]. He's currently staying at his son's place. He will return to his home city on 4/18. He will need prescriptions for Lasix and antibiotics and possibly even Potassium.    LOS: 3 days   Osvaldo Shipper  [facility] Pager [phone] 05/09/2013, 7:50 AM  If 8PM-8AM, please contact night-coverage at [email or web address], password [facility]

## 2013-05-10 ENCOUNTER — Encounter (HOSPITAL_COMMUNITY)
Admission: EM | Disposition: A | Discharge status: Home / Self Care | Payer: Self-pay | Source: Home / Self Care | Attending: Internal Medicine

## 2013-05-10 DIAGNOSIS — I442 Atrioventricular block, complete: Secondary | ICD-10-CM

## 2013-05-10 HISTORY — PX: PERMANENT PACEMAKER INSERTION: SHX5480

## 2013-05-10 LAB — GLUCOSE, CAPILLARY
GLUCOSE-CAPILLARY: 147 mg/dL — AB (ref 70–99)
GLUCOSE-CAPILLARY: 168 mg/dL — AB (ref 70–99)
GLUCOSE-CAPILLARY: 268 mg/dL — AB (ref 70–99)
Glucose-Capillary: 119 mg/dL — ABNORMAL HIGH (ref 70–99)
Glucose-Capillary: 132 mg/dL — ABNORMAL HIGH (ref 70–99)

## 2013-05-10 LAB — BASIC METABOLIC PANEL
BUN: 18 mg/dL (ref 6–23)
CO2: 29 mEq/L (ref 19–32)
Calcium: 8.8 mg/dL (ref 8.4–10.5)
Chloride: 99 mEq/L (ref 96–112)
Creatinine, Ser: 0.96 mg/dL (ref 0.50–1.35)
GFR, EST AFRICAN AMERICAN: 88 mL/min — AB (ref >90)
GFR, EST NON AFRICAN AMERICAN: 76 mL/min — AB (ref >90)
Glucose, Bld: 160 mg/dL — ABNORMAL HIGH (ref 70–99)
POTASSIUM: 3.5 meq/L — AB (ref 3.7–5.3)
Sodium: 141 mEq/L (ref 137–147)

## 2013-05-10 SURGERY — PERMANENT PACEMAKER INSERTION
Anesthesia: LOCAL

## 2013-05-10 MED ORDER — HYDRALAZINE HCL 20 MG/ML IJ SOLN
10.0000 mg | INTRAMUSCULAR | Status: DC | PRN
  Administered 2013-05-12: 10 mg via INTRAVENOUS
  Filled 2013-05-10: qty 1

## 2013-05-10 MED ORDER — LIDOCAINE HCL (PF) 1 % IJ SOLN
INTRAMUSCULAR | Status: AC
  Filled 2013-05-10: qty 60

## 2013-05-10 MED ORDER — CEFAZOLIN SODIUM-DEXTROSE 2-3 GM-% IV SOLR
2.0000 g | INTRAVENOUS | Status: DC
  Filled 2013-05-10: qty 50

## 2013-05-10 MED ORDER — HYDROCODONE-ACETAMINOPHEN 5-325 MG PO TABS: 1.0000 | ORAL_TABLET | ORAL | Status: DC | PRN

## 2013-05-10 MED ORDER — SODIUM CHLORIDE 0.9 % IJ SOLN
3.0000 mL | INTRAMUSCULAR | Status: DC | PRN
Start: 2013-05-10 — End: 2013-05-10

## 2013-05-10 MED ORDER — YOU HAVE A PACEMAKER BOOK
Freq: Once | Status: AC
  Administered 2013-05-10: 22:00:00
  Filled 2013-05-10: qty 1

## 2013-05-10 MED ORDER — SODIUM CHLORIDE 0.9 % IV SOLN: 250.0000 mL | INTRAVENOUS | Status: DC | PRN

## 2013-05-10 MED ORDER — SODIUM CHLORIDE 0.9 % IJ SOLN
3.0000 mL | Freq: Two times a day (BID) | INTRAMUSCULAR | Status: DC
Start: 2013-05-10 — End: 2013-05-10

## 2013-05-10 MED ORDER — SODIUM CHLORIDE 0.9 % IV SOLN: INTRAVENOUS | Status: DC

## 2013-05-10 MED ORDER — CHLORHEXIDINE GLUCONATE 4 % EX LIQD
60.0000 mL | Freq: Once | CUTANEOUS | Status: AC
  Administered 2013-05-10: 4 via TOPICAL

## 2013-05-10 MED ORDER — MIDAZOLAM HCL 5 MG/5ML IJ SOLN
INTRAMUSCULAR | Status: AC
  Filled 2013-05-10: qty 5

## 2013-05-10 MED ORDER — CEFAZOLIN SODIUM 1-5 GM-% IV SOLN
1.0000 g | Freq: Four times a day (QID) | INTRAVENOUS | Status: AC
  Administered 2013-05-10 – 2013-05-11 (×3): 1 g via INTRAVENOUS
  Filled 2013-05-10 (×3): qty 50

## 2013-05-10 MED ORDER — ACETAMINOPHEN 325 MG PO TABS: 325.0000 mg | ORAL_TABLET | ORAL | Status: DC | PRN

## 2013-05-10 MED ORDER — SODIUM CHLORIDE 0.9 % IV SOLN
INTRAVENOUS | Status: DC
Start: 2013-05-10 — End: 2013-05-10

## 2013-05-10 MED ORDER — HEPARIN (PORCINE) IN NACL 2-0.9 UNIT/ML-% IJ SOLN
INTRAMUSCULAR | Status: AC
  Filled 2013-05-10: qty 1000

## 2013-05-10 MED ORDER — HYDROCOD POLST-CHLORPHEN POLST 10-8 MG/5ML PO LQCR
5.0000 mL | Freq: Once | ORAL | Status: AC
  Administered 2013-05-10: 5 mL via ORAL
  Filled 2013-05-10: qty 5

## 2013-05-10 MED ORDER — ONDANSETRON HCL 4 MG/2ML IJ SOLN: 4.0000 mg | Freq: Four times a day (QID) | INTRAMUSCULAR | Status: DC | PRN

## 2013-05-10 MED ORDER — SODIUM CHLORIDE 0.9 % IR SOLN
80.0000 mg | Status: DC
  Filled 2013-05-10: qty 2

## 2013-05-10 MED ORDER — HYDRALAZINE HCL 20 MG/ML IJ SOLN
10.0000 mg | Freq: Once | INTRAMUSCULAR | Status: AC
  Administered 2013-05-10: 20:00:00 10 mg via INTRAVENOUS
  Filled 2013-05-10: qty 1

## 2013-05-10 NOTE — Consult Note (Signed)
ELECTROPHYSIOLOGY CONSULT NOTE   Patient ID: Grant Peck MRN: [id number], DOB/AGE: 78/06/1930   Admit date: 05/06/2013 Date of Consult: 05/10/2013  Primary Physician: Out of town  Primary Cardiologist: Arnoldo Moraleonna Reed, DO at [facility]([place], [place]) Reason for Consultation: Bradycardia  History of Present Illness Grant LyeDonald Retter is a 80 y.o. male with HTN, dyslipidemia, chronic diastolic HF, atrial fibrillation, AAA s/p repair and subdural hematoma who presented 4 days ago with increasing SOB / DOE, LE swelling and cough, found to have bradycardia, acute on chronic diastolic HF and acute bronchitis. On admission, his rate was slow in the 40s. Metoprolol and diltiazem have been held. TSH normal. He has been diuresed. Echo shows normal LVEF, no WMAs, moderate AI, LA 45. He has been monitored on telemetry which reveals persistent bradycardia with V rate 40 bpm despite discontinuation of BB and CCB. EP has been asked to see for recommendations regarding PPM.  Grant Peck denies any history of bradycardia. He thinks he is in AFib all the time. He was previously taking warfarin for stroke prevention but began having frequent falls (8 falls in one year) a couple of years ago, one with subsequent SDH and warfarin was discontinued. He has been taking only aspirin. He states he has never felt his AFib or had any awareness that his heart is out of rhythm. He denies CP, palpitations, dizziness or syncope. He denies recent illness, fever or chills. He denies history of rheumatic fever, thyroid dysfunction, CAD/MI or valvular heart disease.  Past Medical History Past Medical History  Diagnosis Date  . Abdominal aortic aneurysm   . Subdural hematoma   . Urinary incontinence   . Diabetes mellitus without complication   . Hypertension   . Hypercholesteremia   . TIA (transient ischemic attack)   . Arthritis   . Atrial fibrillation     Past Surgical History Past Surgical History    Procedure Laterality Date  . Abdominal aortic aneurysm repair    . Hernia repair    . Subdural hematoma evacuation via craniotomy    . Tonsillectomy      Allergies/Intolerances Allergies  Allergen Reactions  . Ivp Dye [Iodinated Diagnostic Agents] Other (See Comments)    Very "hot feeling", flushed     Current Home Medications      amantadine 100 MG capsule  Commonly known as:  SYMMETREL  Take 100 mg by mouth daily.     aspirin 325 MG tablet  Take 325 mg by mouth every evening.     CLEAR EYES ALL SEASONS 5-6 MG/ML Soln  Generic drug:  Polyvinyl Alcohol-Povidone  Place 1 drop into both eyes daily as needed (For dry eyes).     dextromethorphan 30 MG/5ML liquid  Commonly known as:  DELSYM  Take 30 mLs by mouth as needed for cough.     diltiazem 120 MG 24 hr capsule  Commonly known as:  CARDIZEM CD  Take 120 mg by mouth daily.     finasteride 5 MG tablet  Commonly known as:  PROSCAR  Take 5 mg by mouth daily.     fosinopril 20 MG tablet  Commonly known as:  MONOPRIL  Take 20 mg by mouth daily.     metFORMIN 500 MG tablet  Commonly known as:  GLUCOPHAGE  Take 750 mg by mouth 2 (two) times daily with a meal.     metoprolol succinate 50 MG 24 hr tablet  Commonly known as:  TOPROL-XL  Take 50 mg by  mouth daily. Take with or immediately following a meal.     omeprazole 40 MG capsule  Commonly known as:  PRILOSEC  Take 40 mg by mouth daily.     rosuvastatin 20 MG tablet  Commonly known as:  CRESTOR  Take 20 mg by mouth at bedtime.     terazosin 10 MG capsule  Commonly known as:  HYTRIN  Take 10 mg by mouth at bedtime.     Inpatient Medications . amantadine  100 mg Oral Daily  . amLODipine  5 mg Oral Daily  . aspirin  325 mg Oral QPM  . atorvastatin  40 mg Oral q1800  . azithromycin  500 mg Oral Daily  . cefpodoxime  200 mg Oral Q12H  . finasteride  5 mg Oral Daily  . furosemide  40 mg Oral BID  . insulin aspart  0-15 Units Subcutaneous TID WC  .  lisinopril  40 mg Oral Daily  . pantoprazole  40 mg Oral Daily  . potassium chloride  40 mEq Oral BID  . sodium chloride  3 mL Intravenous Q12H    Family History Family History  Problem Relation Age of Onset  . Heart disease Father      Social History History   Social History  . Marital Status: Married    Spouse Name: N/A    Number of Children: 6  . Years of Education: N/A   Occupational History  . Not on file.   Social History Main Topics  . Smoking status: Never Smoker   . Smokeless tobacco: Not on file  . Alcohol Use: No  . Drug Use: Yes  . Sexual Activity: Not on file   Other Topics Concern  . Not on file   Social History Narrative  . No narrative on file     Review of Systems General: No chills, fever, night sweats or weight changes  Cardiovascular:  No chest pain, edema, orthopnea, palpitations, paroxysmal nocturnal dyspnea Dermatological: No rash, lesions or masses Respiratory: No cough, dyspnea Urologic: No hematuria, dysuria Abdominal: No nausea, vomiting, diarrhea, bright red blood per rectum, melena, or hematemesis Neurologic: No visual changes, weakness, changes in mental status All other systems reviewed and are otherwise negative except as noted above.  Physical Exam Vitals: Blood pressure 158/56, pulse 40, temperature 98.2 F (36.8 C), temperature source Oral, resp. rate 24, height 5\' 7"  (1.702 m), weight 202 lb 9.6 oz (91.899 kg), SpO2 97.00%.  General: Well developed, well appearing 80 y.o. male in no acute distress. HEENT: Normocephalic, atraumatic. EOMs intact. Sclera nonicteric. Oropharynx clear.  Neck: Supple. No JVD. Lungs: Respirations regular and unlabored, CTA bilaterally. No wheezes, rales or rhonchi. Heart: Irregular. S1, S2 present. No murmurs, rub, S3 or S4. Abdomen: Soft, non-tender, non-distended. BS present x 4 quadrants. No hepatosplenomegaly.  Extremities: No clubbing, cyanosis or edema. DP/PT/Radials 2+ and equal  bilaterally. Psych: Normal affect. Neuro: Alert and oriented X 3. Moves all extremities spontaneously. Musculoskeletal: No kyphosis. Skin: Intact. Warm and dry. No rashes or petechiae in exposed areas.   Labs ProBNP 2384 Troponin <0.03 x 3  Lab Results  Component Value Date   WBC 7.8 05/09/2013   HGB 15.2 05/09/2013   HCT 45.9 05/09/2013   MCV 92.9 05/09/2013   PLT 184 05/09/2013    Recent Labs Lab 05/06/13 1345  05/10/13 0310  NA 137  < > 141  K 4.1  < > 3.5*  CL 100  < > 99  CO2 21  < >  29  BUN 13  < > 18  CREATININE 0.92  < > 0.96  CALCIUM 9.3  < > 8.8  PROT 7.2  --   --   BILITOT 1.2  --   --   ALKPHOS 107  --   --   ALT 49  --   --   AST 45*  --   --   GLUCOSE 185*  < > 160*  < > = values in this interval not displayed.  Blood cultures x 2 drawn 05/06/2013 - no growth to date  Radiology/Studies Dg Chest 2 View 05/08/2013    FINDINGS: Persistent cardiomegaly with central vascular congestion. Similar small effusions, best appreciated on the lateral view. Similar mild interstitial opacities in the right lower lobe and left upper lobe as before, compatible with asymmetric edema, less likely pneumonia. Trachea is midline. Atherosclerosis of the aorta. Rib deformities on the right with pleural thickening, suspect remote trauma.   IMPRESSION: Cardiomegaly with right lower lobe and left upper lobe mild interstitial edema pattern.    Electronically Signed   By: Ruel Favorsrevor  Shick M.D.   On: 05/08/2013 08:37   Dg Chest 2 View 05/06/2013   FINDINGS: Low lung volumes. Cardiac silhouette is enlarged. Diffuse bilateral pulmonary opacities appreciated left greater than right. There is mild prominence of interstitial markings and areas of mild peribronchial cuffing. Increased density within the lung bases right greater than left. Blunting of the costophrenic angles. Mild osteoarthritic changes within the shoulders.   IMPRESSION: Interstitial findings likely reflecting pulmonary edema  Areas  of atelectasis versus asymmetric edema within the lung bases  Trace bilateral effusions.  A non edematous infiltrate particularly within the right lung base cannot be excluded. Continued surveillance evaluation recommended.    Electronically Signed   By: Salome HolmesHector  Cooper M.D.   On: 05/06/2013 13:12   Echocardiogram  Study Conclusions - Left ventricle: The cavity size was normal. Systolic function was vigorous. The estimated ejection fraction was in the range of 65% to 70%. Wall motion was normal; there were no regional wall motion abnormalities. - Aortic valve: Trileaflet; mildly thickened leaflets. Moderate regurgitation. Valve area: 3.26cm^2 (Vmax). - Aortic root: The aortic root was normal in size. - Mitral valve: No regurgitation. - Left atrium: The atrium was moderately dilated. - Right ventricle: Systolic function was normal. - Right atrium: The atrium was normal in size. - Tricuspid valve: No regurgitation. - Pulmonary arteries: Systolic pressure was within the normal range.  12-lead ECG on admission - regular, wide complex bradycardia without discernible P waves, most consistent with junctional bradycardia at 40 bpm (probable RBBB at baseline but [facility] Telemetry reviewed in full - persistent bradycardia with V rates in 40s predominantly  Assessment and Plan Junctional bradycardia Atrial fibrillation Normal LVEF Chronic diastolic HF URI / acute bronchitis History of subdural hematoma  Grant Peck has persistent bradycardia despite discontinuation of BB and CCB >48 hours ago. No ischemic changes on ECG, serial troponin negative, no anginal symptoms and no WMAs on echo - doubt coronary ischemia. Will check TSH. Echo shows normal LVEF, no WMAs, moderate AI, LA 45. There are no reversible causes identified; therefore, he meets criteria for PPM implantation. Risks, benefits and alternatives to PPM implantation were discussed in  detail with Grant Peck and his wife today. These risks include, but are not limited to, bleeding, infection, pneumothorax, perforation, tamponade, vascular damage, renal failure, lead dislodgement, MI, stroke and death. Grant Peck and his  wife expressed verbal understanding and agrees to proceed. I left a message with his primary cardiologist, Dr. Renato Gails, today and also requested records.  Signed, Herby Abraham Edmisten, PA-C 05/10/2013, 9:22 AM   I have seen, examined the patient, and reviewed the above assessment and plan.  Changes to above are made where necessary.   The patient has permanent afib with complete heart block.  This persists despite medicine washout.  No reversible causes have been found. The patient has symptomatic bradycardia.  I would therefore recommend pacemaker implantation at this time.  Risks, benefits, alternatives to pacemaker implantation were discussed in detail with the patient today. The patient understands that the risks include but are not limited to bleeding, infection, pneumothorax, perforation, tamponade, vascular damage, renal failure, MI, stroke, death,  and lead dislodgement and wishes to proceed. We will therefore schedule the procedure at the next available time.   Co Sign: Hillis Range, MD 05/10/2013 4:10 PM

## 2013-05-10 NOTE — Progress Notes (Signed)
Pt c/o cough and congestion. Called midlevel to see if pt could have cough suppressant. Awaiting call back.

## 2013-05-10 NOTE — Interval H&P Note (Signed)
History and Physical Interval Note:  05/10/2013 4:12 PM  [name]  has presented today for surgery, with the diagnosis of bradycardia  The various methods of treatment have been discussed with the patient and family. After consideration of risks, benefits and other options for treatment, the patient has consented to  Procedure(s): PERMANENT PACEMAKER INSERTION (N/A) as a surgical intervention .  The patient's history has been reviewed, patient examined, no change in status, stable for surgery.  I have reviewed the patient's chart and labs.  Questions were answered to the patient's satisfaction.     Hillis RangeJames Danah Reinecke

## 2013-05-10 NOTE — Op Note (Signed)
SURGEON:  Hillis RangeJames Shalon Salado, MD     PREPROCEDURE DIAGNOSIS:  Permanent atrial fibrillation, complete heart block    POSTPROCEDURE DIAGNOSIS:  Permanent atrial fibrillation, complete heart block     PROCEDURES:   1. Pacemaker implantation.     INTRODUCTION: Grant LyeDonald Peck is a 80 y.o. male  with a history of permanent atrial fibrillation and complete heart block who presents today for pacemaker implantation.  The patient reports progressive SOB and decreased exercise tolererance.  EKG reveals afib with complete heart block.  AV nodal agents have been discontinued for more than 48 hours without improvement in conduction or heart rates.  No reversible causes have been identified.  The patient therefore presents today for pacemaker implantation.     DESCRIPTION OF PROCEDURE:  Informed written consent was obtained, and the patient was brought to the electrophysiology lab in a fasting state.  The patient required no sedation for the procedure today.  The patients left chest was prepped and draped in the usual sterile fashion by the EP lab staff. The skin overlying the left deltopectoral region was infiltrated with lidocaine for local analgesia.  A 4-cm incision was made over the left deltopectoral region.  A left subcutaneous pacemaker pocket was fashioned using a combination of sharp and blunt dissection. Electrocautery was required to assure hemostasis.    RA/RV Lead Placement: Through the left axillary vein, a Medtronic model [id number] (serial number [id number]) right ventricular lead was advanced with fluoroscopic visualization into the right ventricular apex position.  Initial right ventricular lead R-waves measured  8 mV with an impedance of 670 ohms and a threshold of 0.2 V at 0.5 msec.  The lead was secured to the pectoralis fascia using #2-0 silk over the suture sleeves.   Device Placement: The lead was then connected to a Medtronic TanainaSensia model [id number] (serial number [id number]) pacemaker.  The pocket was  irrigated with copious gentamicin solution.  The pacemaker was then placed into the pocket.  The pocket was then closed in 2 layers with 2.0 Vicryl suture for the subcutaneous and subcuticular layers.  Steri- Strips and a sterile dressing were then applied.  There were no early apparent complications.  No contrast was required for this procedure.    CONCLUSIONS:   1. Successful implantation of a Medtronic Sensia pacemaker for symptomatic complete heart block and atrial fibrillation.  2. No early apparent complications.        Hillis RangeJames Yusuf Yu, MD 05/10/2013 5:13 PM

## 2013-05-10 NOTE — H&P (View-Only) (Signed)
ELECTROPHYSIOLOGY CONSULT NOTE   Patient ID: Grant Peck MRN: [id number], DOB/AGE: 78/06/1930   Admit date: 05/06/2013 Date of Consult: 05/10/2013  Primary Physician: Out of town  Primary Cardiologist: Arnoldo Moraleonna Reed, DO at [facility]([place], [place]) Reason for Consultation: Bradycardia  History of Present Illness Grant Peck is a 80 y.o. male with HTN, dyslipidemia, chronic diastolic HF, atrial fibrillation, AAA s/p repair and subdural hematoma who presented 4 days ago with increasing SOB / DOE, LE swelling and cough, found to have bradycardia, acute on chronic diastolic HF and acute bronchitis. On admission, his rate was slow in the 40s. Metoprolol and diltiazem have been held. TSH normal. He has been diuresed. Echo shows normal LVEF, no WMAs, moderate AI, LA 45. He has been monitored on telemetry which reveals persistent bradycardia with V rate 40 bpm despite discontinuation of BB and CCB. EP has been asked to see for recommendations regarding PPM.  Grant Peck denies any history of bradycardia. He thinks he is in AFib all the time. He was previously taking warfarin for stroke prevention but began having frequent falls (8 falls in one year) a couple of years ago, one with subsequent SDH and warfarin was discontinued. He has been taking only aspirin. He states he has never felt his AFib or had any awareness that his heart is out of rhythm. He denies CP, palpitations, dizziness or syncope. He denies recent illness, fever or chills. He denies history of rheumatic fever, thyroid dysfunction, CAD/MI or valvular heart disease.  Past Medical History Past Medical History  Diagnosis Date  . Abdominal aortic aneurysm   . Subdural hematoma   . Urinary incontinence   . Diabetes mellitus without complication   . Hypertension   . Hypercholesteremia   . TIA (transient ischemic attack)   . Arthritis   . Atrial fibrillation     Past Surgical History Past Surgical History    Procedure Laterality Date  . Abdominal aortic aneurysm repair    . Hernia repair    . Subdural hematoma evacuation via craniotomy    . Tonsillectomy      Allergies/Intolerances Allergies  Allergen Reactions  . Ivp Dye [Iodinated Diagnostic Agents] Other (See Comments)    Very "hot feeling", flushed     Current Home Medications      amantadine 100 MG capsule  Commonly known as:  SYMMETREL  Take 100 mg by mouth daily.     aspirin 325 MG tablet  Take 325 mg by mouth every evening.     CLEAR EYES ALL SEASONS 5-6 MG/ML Soln  Generic drug:  Polyvinyl Alcohol-Povidone  Place 1 drop into both eyes daily as needed (For dry eyes).     dextromethorphan 30 MG/5ML liquid  Commonly known as:  DELSYM  Take 30 mLs by mouth as needed for cough.     diltiazem 120 MG 24 hr capsule  Commonly known as:  CARDIZEM CD  Take 120 mg by mouth daily.     finasteride 5 MG tablet  Commonly known as:  PROSCAR  Take 5 mg by mouth daily.     fosinopril 20 MG tablet  Commonly known as:  MONOPRIL  Take 20 mg by mouth daily.     metFORMIN 500 MG tablet  Commonly known as:  GLUCOPHAGE  Take 750 mg by mouth 2 (two) times daily with a meal.     metoprolol succinate 50 MG 24 hr tablet  Commonly known as:  TOPROL-XL  Take 50 mg by  mouth daily. Take with or immediately following a meal.     omeprazole 40 MG capsule  Commonly known as:  PRILOSEC  Take 40 mg by mouth daily.     rosuvastatin 20 MG tablet  Commonly known as:  CRESTOR  Take 20 mg by mouth at bedtime.     terazosin 10 MG capsule  Commonly known as:  HYTRIN  Take 10 mg by mouth at bedtime.     Inpatient Medications . amantadine  100 mg Oral Daily  . amLODipine  5 mg Oral Daily  . aspirin  325 mg Oral QPM  . atorvastatin  40 mg Oral q1800  . azithromycin  500 mg Oral Daily  . cefpodoxime  200 mg Oral Q12H  . finasteride  5 mg Oral Daily  . furosemide  40 mg Oral BID  . insulin aspart  0-15 Units Subcutaneous TID WC  .  lisinopril  40 mg Oral Daily  . pantoprazole  40 mg Oral Daily  . potassium chloride  40 mEq Oral BID  . sodium chloride  3 mL Intravenous Q12H    Family History Family History  Problem Relation Age of Onset  . Heart disease Father      Social History History   Social History  . Marital Status: Married    Spouse Name: N/A    Number of Children: 6  . Years of Education: N/A   Occupational History  . Not on file.   Social History Main Topics  . Smoking status: Never Smoker   . Smokeless tobacco: Not on file  . Alcohol Use: No  . Drug Use: Yes  . Sexual Activity: Not on file   Other Topics Concern  . Not on file   Social History Narrative  . No narrative on file     Review of Systems General: No chills, fever, night sweats or weight changes  Cardiovascular:  No chest pain, edema, orthopnea, palpitations, paroxysmal nocturnal dyspnea Dermatological: No rash, lesions or masses Respiratory: No cough, dyspnea Urologic: No hematuria, dysuria Abdominal: No nausea, vomiting, diarrhea, bright red blood per rectum, melena, or hematemesis Neurologic: No visual changes, weakness, changes in mental status All other systems reviewed and are otherwise negative except as noted above.  Physical Exam Vitals: Blood pressure 158/56, pulse 40, temperature 98.2 F (36.8 C), temperature source Oral, resp. rate 24, height 5\' 7"  (1.702 m), weight 202 lb 9.6 oz (91.899 kg), SpO2 97.00%.  General: Well developed, well appearing 80 y.o. male in no acute distress. HEENT: Normocephalic, atraumatic. EOMs intact. Sclera nonicteric. Oropharynx clear.  Neck: Supple. No JVD. Lungs: Respirations regular and unlabored, CTA bilaterally. No wheezes, rales or rhonchi. Heart: Irregular. S1, S2 present. No murmurs, rub, S3 or S4. Abdomen: Soft, non-tender, non-distended. BS present x 4 quadrants. No hepatosplenomegaly.  Extremities: No clubbing, cyanosis or edema. DP/PT/Radials 2+ and equal  bilaterally. Psych: Normal affect. Neuro: Alert and oriented X 3. Moves all extremities spontaneously. Musculoskeletal: No kyphosis. Skin: Intact. Warm and dry. No rashes or petechiae in exposed areas.   Labs ProBNP 2384 Troponin <0.03 x 3  Lab Results  Component Value Date   WBC 7.8 05/09/2013   HGB 15.2 05/09/2013   HCT 45.9 05/09/2013   MCV 92.9 05/09/2013   PLT 184 05/09/2013    Recent Labs Lab 05/06/13 1345  05/10/13 0310  NA 137  < > 141  K 4.1  < > 3.5*  CL 100  < > 99  CO2 21  < >  29  BUN 13  < > 18  CREATININE 0.92  < > 0.96  CALCIUM 9.3  < > 8.8  PROT 7.2  --   --   BILITOT 1.2  --   --   ALKPHOS 107  --   --   ALT 49  --   --   AST 45*  --   --   GLUCOSE 185*  < > 160*  < > = values in this interval not displayed.  Blood cultures x 2 drawn 05/06/2013 - no growth to date  Radiology/Studies Dg Chest 2 View 05/08/2013    FINDINGS: Persistent cardiomegaly with central vascular congestion. Similar small effusions, best appreciated on the lateral view. Similar mild interstitial opacities in the right lower lobe and left upper lobe as before, compatible with asymmetric edema, less likely pneumonia. Trachea is midline. Atherosclerosis of the aorta. Rib deformities on the right with pleural thickening, suspect remote trauma.   IMPRESSION: Cardiomegaly with right lower lobe and left upper lobe mild interstitial edema pattern.    Electronically Signed   By: Ruel Favorsrevor  Shick M.D.   On: 05/08/2013 08:37   Dg Chest 2 View 05/06/2013   FINDINGS: Low lung volumes. Cardiac silhouette is enlarged. Diffuse bilateral pulmonary opacities appreciated left greater than right. There is mild prominence of interstitial markings and areas of mild peribronchial cuffing. Increased density within the lung bases right greater than left. Blunting of the costophrenic angles. Mild osteoarthritic changes within the shoulders.   IMPRESSION: Interstitial findings likely reflecting pulmonary edema  Areas  of atelectasis versus asymmetric edema within the lung bases  Trace bilateral effusions.  A non edematous infiltrate particularly within the right lung base cannot be excluded. Continued surveillance evaluation recommended.    Electronically Signed   By: Salome HolmesHector  Cooper M.D.   On: 05/06/2013 13:12   Echocardiogram  Study Conclusions - Left ventricle: The cavity size was normal. Systolic function was vigorous. The estimated ejection fraction was in the range of 65% to 70%. Wall motion was normal; there were no regional wall motion abnormalities. - Aortic valve: Trileaflet; mildly thickened leaflets. Moderate regurgitation. Valve area: 3.26cm^2 (Vmax). - Aortic root: The aortic root was normal in size. - Mitral valve: No regurgitation. - Left atrium: The atrium was moderately dilated. - Right ventricle: Systolic function was normal. - Right atrium: The atrium was normal in size. - Tricuspid valve: No regurgitation. - Pulmonary arteries: Systolic pressure was within the normal range.  12-lead ECG on admission - regular, wide complex bradycardia without discernible P waves, most consistent with junctional bradycardia at 40 bpm (probable RBBB at baseline but [facility] Telemetry reviewed in full - persistent bradycardia with V rates in 40s predominantly  Assessment and Plan Junctional bradycardia Atrial fibrillation Normal LVEF Chronic diastolic HF URI / acute bronchitis History of subdural hematoma  Grant Peck has persistent bradycardia despite discontinuation of BB and CCB >48 hours ago. No ischemic changes on ECG, serial troponin negative, no anginal symptoms and no WMAs on echo - doubt coronary ischemia. Will check TSH. Echo shows normal LVEF, no WMAs, moderate AI, LA 45. There are no reversible causes identified; therefore, he meets criteria for PPM implantation. Risks, benefits and alternatives to PPM implantation were discussed in  detail with Grant Peck and his wife today. These risks include, but are not limited to, bleeding, infection, pneumothorax, perforation, tamponade, vascular damage, renal failure, lead dislodgement, MI, stroke and death. Grant Peck and his  wife expressed verbal understanding and agrees to proceed. I left a message with his primary cardiologist, Dr. Renato Gails, today and also requested records.  Signed, Herby Abraham Edmisten, PA-C 05/10/2013, 9:22 AM   I have seen, examined the patient, and reviewed the above assessment and plan.  Changes to above are made where necessary.   The patient has permanent afib with complete heart block.  This persists despite medicine washout.  No reversible causes have been found. The patient has symptomatic bradycardia.  I would therefore recommend pacemaker implantation at this time.  Risks, benefits, alternatives to pacemaker implantation were discussed in detail with the patient today. The patient understands that the risks include but are not limited to bleeding, infection, pneumothorax, perforation, tamponade, vascular damage, renal failure, MI, stroke, death,  and lead dislodgement and wishes to proceed. We will therefore schedule the procedure at the next available time.   Co Sign: Hillis Range, MD 05/10/2013 4:10 PM

## 2013-05-10 NOTE — Progress Notes (Signed)
[facility] PROGRESS NOTE  Grant Peck ZOX:[id number] DOB: 08/28/1930 DOA: 05/06/2013  PCP: Dr. Linward FosterGunning? [place]  Brief HPI: Grant Peck is a 80 y.o. male with a past medical history of atrial fibrillation not on anticoagulation, diabetes mellitus type II, hypertension, who denied any history of congestive heart failure who was in his usual state of health till about 2 days prior to admission, when he started developing shortness of breath, and cough. He was admitted with acute CHF with possible pneumonia. [place]  Consultants: Cardiology  Procedures:  LE Venous Doppler 4/11 No DVT  2D ECHO 4/12 Left ventricle: The cavity size was normal. Systolic function was vigorous. The estimated ejection fraction was in the range of 65% to 70%. Wall motion was normal; there were no regional wall motion abnormalities. - Aortic valve: Trileaflet; mildly thickened leaflets. Moderate regurgitation. Valve area: 3.26cm^2 (Vmax). - Aortic root: The aortic root was normal in size. - Mitral valve: No regurgitation. - Left atrium: The atrium was moderately dilated. - Right ventricle: Systolic function was normal. - Right atrium: The atrium was normal in size. - Tricuspid valve: No regurgitation. - Pulmonary arteries: Systolic pressure was within the normal range.  Antibiotics: Ceftriaxone 4/11-->4/13 Azithromycin 4/11--> Vantin 4/13-->  Subjective: Doing well A little hungry. No sob, cp, n,v,fever,diarrhea Aware of need for PPM and understands r/b/a  Objective: Vital Signs  Filed Vitals:   05/10/13 0230 05/10/13 0251 05/10/13 0452 05/10/13 0501  BP:   158/56   Pulse:   40   Temp:   98.2 F (36.8 C)   TempSrc:   Oral   Resp:   24   Height:      Weight:   91.899 kg (202 lb 9.6 oz)   SpO2: 96% 95% 93% 97%    Intake/Output Summary (Last 24 hours) at 05/10/13 1134 Last data filed at 05/10/13 0900  Gross per 24 hour  Intake   1200 ml  Output    1203 ml  Net     -3 ml   Filed Weights   05/08/13 0548 05/09/13 0500 05/10/13 0452  Weight: 91.9 kg (202 lb 9.6 oz) 91.4 kg (201 lb 8 oz) 91.899 kg (202 lb 9.6 oz)    General appearance: alert, cooperative, appears stated age, no distress and moderately obese Resp: crackles bilaterally but less than yesterday. Improved air entry. No wheezing. Cardio: S1S2 bradycardic, regular, No s3s4. No rubs bruits, murmurs. No pedal edema GI: soft, non-tender; bowel sounds normal; no masses,  no organomegaly Neurologic: Alert and oriented x 3. No focal deficits.  Lab Results:  Basic Metabolic Panel:  Recent Labs Lab 05/06/13 1345 05/06/13 1605 05/07/13 0625 05/08/13 0333 05/09/13 0401 05/10/13 0310  NA 137  --  139 139 142 141  K 4.1  --  3.6* 3.3* 3.7 3.5*  CL 100  --  100 98 98 99  CO2 21  --  26 27 30 29   GLUCOSE 185*  --  157* 165* 152* 160*  BUN 13  --  15 18 19 18   CREATININE 0.92  --  1.01 0.97 1.01 0.96  CALCIUM 9.3  --  8.9 8.6 9.2 8.8  MG  --  1.6  --   --   --   --    Liver Function Tests:  Recent Labs Lab 05/06/13 1345  AST 45*  ALT 49  ALKPHOS 107  BILITOT 1.2  PROT 7.2  ALBUMIN 3.5   CBC:  Recent Labs  Lab 05/06/13 1345 05/07/13 0625 05/08/13 0333 05/09/13 0401  WBC 10.3 8.6 8.6 7.8  NEUTROABS 8.3*  --   --   --   HGB 14.4 13.5 14.2 15.2  HCT 42.3 41.1 42.1 45.9  MCV 91.4 92.6 92.3 92.9  PLT 147* 146* 159 184   Cardiac Enzymes:  Recent Labs Lab 05/06/13 1605 05/06/13 2145 05/07/13 0625  TROPONINI <0.30 <0.30 <0.30   BNP (last 3 results)  Recent Labs  05/06/13 1345 05/07/13 0625  PROBNP 4582.0* 2384.0*   CBG:  Recent Labs Lab 05/09/13 0731 05/09/13 1218 05/09/13 1657 05/09/13 2102 05/10/13 0736  GLUCAP 149* 186* 183* 226* 147*    Recent Results (from the past 240 hour(s))  CULTURE, BLOOD (ROUTINE X 2)     Status: None   Collection Time    05/06/13  1:45 PM      Result Value Ref Range Status   Specimen Description BLOOD  RIGHT ANTECUBITAL   Final   Special Requests BOTTLES DRAWN AEROBIC AND ANAEROBIC 4CC EACH   Final   Culture  Setup Time     Final   Value: 05/06/2013 17:24     Performed at [facility]   Culture     Final   Value:        BLOOD CULTURE RECEIVED NO GROWTH TO DATE CULTURE WILL BE HELD FOR 5 DAYS BEFORE ISSUING A FINAL NEGATIVE REPORT     Performed at [facility]   Report Status PENDING   Incomplete  CULTURE, BLOOD (ROUTINE X 2)     Status: None   Collection Time    05/06/13  2:00 PM      Result Value Ref Range Status   Specimen Description BLOOD LEFT ARM  5 ML IN [facility] BOTTLE   Final   Special Requests Normal   Final   Culture  Setup Time     Final   Value: 05/06/2013 17:24     Performed at [facility]   Culture     Final   Value:        BLOOD CULTURE RECEIVED NO GROWTH TO DATE CULTURE WILL BE HELD FOR 5 DAYS BEFORE ISSUING A FINAL NEGATIVE REPORT     Performed at [facility]   Report Status PENDING   Incomplete      Studies/Results: No results found.  Medications:  Scheduled: . amantadine  100 mg Oral Daily  . amLODipine  5 mg Oral Daily  . aspirin  325 mg Oral QPM  . atorvastatin  40 mg Oral q1800  . azithromycin  500 mg Oral Daily  . cefpodoxime  200 mg Oral Q12H  . finasteride  5 mg Oral Daily  . furosemide  40 mg Oral BID  . insulin aspart  0-15 Units Subcutaneous TID WC  . lisinopril  40 mg Oral Daily  . pantoprazole  40 mg Oral Daily  . potassium chloride  40 mEq Oral BID  . sodium chloride  3 mL Intravenous Q12H   Continuous:  ZOX:WRUEAV chloride, albuterol, hydrALAZINE, sodium chloride  Assessment/Plan:  Principal Problem:   Acute CHF (congestive heart failure) Active Problems:   CAP (community acquired pneumonia)   Bradycardia   Atrial fibrillation   DM type 2 (diabetes mellitus, type 2)   Acute respiratory failure with hypoxia    Acute respiratory failure with hypoxia Much improved. This was most likely secondary  to pulmonary edema/congestive heart failure. RA sats dropped with ambulation on 4/13 but normal  at rest on RA. This will need to be repeated on 4/15.   Acute CHF and Hypertension ECHO report reviewed. Normal EF and no mention of diastolic HF. Bradycardia could have contributed. He is diuresing well. Diuretics changed to oral by cards. On Potassium as well. Strict I/O. Daily weights. BP not well controlled. Increased ACEI 4/12 and Amlodipine added by cards 4/13. Dose increased today. Troponins are negative.  Bradycardia in setting of Atrial Fibrillation HR seems to be better but still low. Patient however is improving. This was possibly medication induced. Cardiology is following. Continue Tele. Holding BB and CCB. Not on anticoagulation. Has history of subdural hematoma 2 years ago. TSH is normal.  As HR still in 40's Cardiology consulted EP and he is scheduled for PPM implantation and [facility] if all stable subsequent to procedure  Possible CAP Continue oral antibiotics. Repeat CXR pending more suggestive of CHF but he was exhibiting signs of bronchitis. He should complete a 7 day course.  Diabetes mellitus, type II Holding his metformin. Continue SSI. HbA1c is 8.0. CBG's stable.  Enlarged Right Leg No DVT noted on doppler study.   History of subdural hematoma 2 years ago No recent falls or injuries. No neurological deficits.   DVT Prophylaxis: Lovenox  Code Status: Full code  Family Communication: Discussed with patient.  Disposition Plan: Await PPM and then decide   LOS: 4 days  Pleas KochJai Romeka Scifres, MD [facility] [phone]) [phone]  05/10/2013, 11:34 AM  If 8PM-8AM, please contact night-coverage at [email or web address], password [email or web address]

## 2013-05-10 NOTE — Progress Notes (Signed)
Received from [facility]. No complaints at present. Atrial fib with a ventricular response 42-44. BP 210/86. SpO2 93% on room air. NSL right AC flushes easily site unremarkable. CBG 119

## 2013-05-10 NOTE — Progress Notes (Signed)
TELEMETRY: Reviewed telemetry pt in Atrial fibrillation with slow ventricular response 40 bpm:  Filed Vitals:   05/10/13 0230 05/10/13 0251 05/10/13 0452 05/10/13 0501  BP:   158/56   Pulse:   40   Temp:   98.2 F (36.8 C)   TempSrc:   Oral   Resp:   24   Height:      Weight:   202 lb 9.6 oz (91.899 kg)   SpO2: 96% 95% 93% 97%    Intake/Output Summary (Last 24 hours) at 05/10/13 0711 Last data filed at 05/10/13 0606  Gross per 24 hour  Intake   1320 ml  Output   1202 ml  Net    118 ml    SUBJECTIVE Feels better today. Cough improved. Less SOB. No chest pain. Denies dizziness.  LABS: Basic Metabolic Panel:  Recent Labs  16/10/9602/14/15 0401 05/10/13 0310  NA 142 141  K 3.7 3.5*  CL 98 99  CO2 30 29  GLUCOSE 152* 160*  BUN 19 18  CREATININE 1.01 0.96  CALCIUM 9.2 8.8   CBC:  Recent Labs  05/08/13 0333 05/09/13 0401  WBC 8.6 7.8  HGB 14.2 15.2  HCT 42.1 45.9  MCV 92.3 92.9  PLT 159 184   Cardiac Enzymes: No results found for this basename: CKTOTAL, CKMB, CKMBINDEX, TROPONINI,  in the last 72 hours BNP: 2384  Thyroid Function Tests: No results found for this basename: TSH, T4TOTAL, FREET3, T3FREE, THYROIDAB,  in the last 72 hours  Radiology/Studies:  Dg Chest 2 View  05/06/2013   CLINICAL DATA:  COUGH SHORTNESS OF BREATH  EXAM: CHEST  2 VIEW  COMPARISON:  None.  FINDINGS: Low lung volumes. Cardiac silhouette is enlarged. Diffuse bilateral pulmonary opacities appreciated left greater than right. There is mild prominence of interstitial markings and areas of mild peribronchial cuffing. Increased density within the lung bases right greater than left. Blunting of the costophrenic angles. Mild osteoarthritic changes within the shoulders.  IMPRESSION: Interstitial findings likely reflecting pulmonary edema  Areas of atelectasis versus asymmetric edema within the lung bases  Trace bilateral effusions.  A non edematous infiltrate particularly within the right lung  base cannot be excluded. Continued surveillance evaluation recommended.   Electronically Signed   By: Salome HolmesHector  Cooper M.D.   On: 05/06/2013 13:12   Ecg: atrial fibrillation with slow ventricular response/ junctional escape. RBBB.  Echo: Study Conclusions  - Left ventricle: The cavity size was normal. Systolic function was vigorous. The estimated ejection fraction was in the range of 65% to 70%. Wall motion was normal; there were no regional wall motion abnormalities. - Aortic valve: Trileaflet; mildly thickened leaflets. Moderate regurgitation. Valve area: 3.26cm^2 (Vmax). - Aortic root: The aortic root was normal in size. - Mitral valve: No regurgitation. - Left atrium: The atrium was moderately dilated. - Right ventricle: Systolic function was normal. - Right atrium: The atrium was normal in size. - Tricuspid valve: No regurgitation. - Pulmonary arteries: Systolic pressure was within the normal range.   PHYSICAL EXAM General: Well developed, well nourished, in no acute distress. Head: Normocephalic, atraumatic, sclera non-icteric, no xanthomas, nares are without discharge. Neck: Negative for carotid bruits. JVD not elevated. Lungs: mild left basilar rales. Improved. Heart: IRRR, bradycardic, S1 S2 without murmurs, rubs, or gallops.  Abdomen: Soft, obese, non-tender, non-distended with normoactive bowel sounds. No hepatomegaly. No rebound/guarding. No obvious abdominal masses. Msk:  Strength and tone appears normal for age. Extremities: No clubbing, cyanosis or edema.  Distal pedal  pulses are 2+ and equal bilaterally. Neuro: Alert and oriented X 3. Moves all extremities spontaneously. Psych:  Responds to questions appropriately with a normal affect.  ASSESSMENT AND PLAN: 1. Acute on chronic diastolic CHF. Diuresis has slowed with switch to po lasix. Weight down ( 11 lbs since admit ). Normal LV function by Echo. Continue lasix 40 mg bid. Continue ARB. Not a candidate for beta  blocker due to bradycardia. 2. Atrial fibrillation with slow ventricular response. Metoprolol and cardizem held. HR really hasn't improved > 48 hrs off metoprolol and diltiazem. Still with junctional rhythm rate 40. I think this is contributing to his CHF. Will keep NPO this am. I will ask EP to see for possible pacemaker.  3. Moderate aortic insufficiency. 4. Hypokalemia- repleting 5. URI/ acute bronchitis. On antibiotics and breathing Rx.  6. HTN. Continue norvasc and lisinopril. 7. History of SDH. Presumably this is why he is not on coumadin.  8. Hyperlipidemia. On statin. 9. DM type 2  Principal Problem:   Acute CHF (congestive heart failure) Active Problems:   CAP (community acquired pneumonia)   Bradycardia   Atrial fibrillation   DM type 2 (diabetes mellitus, type 2)   Acute respiratory failure with hypoxia    Signed, Oran Dillenburg M [place] MD,FAACC 05/10/2013 7:11 AM

## 2013-05-11 ENCOUNTER — Encounter (HOSPITAL_COMMUNITY): Payer: Self-pay | Admitting: Cardiology

## 2013-05-11 ENCOUNTER — Inpatient Hospital Stay (HOSPITAL_COMMUNITY): Payer: Medicare Other

## 2013-05-11 LAB — BASIC METABOLIC PANEL
BUN: 14 mg/dL (ref 6–23)
CO2: 25 meq/L (ref 19–32)
Calcium: 9.8 mg/dL (ref 8.4–10.5)
Chloride: 99 mEq/L (ref 96–112)
Creatinine, Ser: 0.94 mg/dL (ref 0.50–1.35)
GFR calc Af Amer: 88 mL/min — ABNORMAL LOW (ref >90)
GFR calc non Af Amer: 76 mL/min — ABNORMAL LOW (ref >90)
GLUCOSE: 176 mg/dL — AB (ref 70–99)
POTASSIUM: 4.9 meq/L (ref 3.7–5.3)
SODIUM: 139 meq/L (ref 137–147)

## 2013-05-11 LAB — GLUCOSE, CAPILLARY
GLUCOSE-CAPILLARY: 144 mg/dL — AB (ref 70–99)
Glucose-Capillary: 161 mg/dL — ABNORMAL HIGH (ref 70–99)
Glucose-Capillary: 136 mg/dL — ABNORMAL HIGH (ref 70–99)
Glucose-Capillary: 179 mg/dL — ABNORMAL HIGH (ref 70–99)

## 2013-05-11 LAB — B. BURGDORFI ANTIBODIES: B burgdorferi Ab IgG+IgM: 0.25 {ISR}

## 2013-05-11 MED ORDER — CARVEDILOL 6.25 MG PO TABS
6.2500 mg | ORAL_TABLET | Freq: Two times a day (BID) | ORAL | Status: DC
  Administered 2013-05-11 (×2): 6.25 mg via ORAL
  Filled 2013-05-11 (×2): qty 2
  Filled 2013-05-11 (×3): qty 1
  Filled 2013-05-11: qty 2
  Filled 2013-05-11 (×2): qty 1

## 2013-05-11 MED ORDER — AMLODIPINE BESYLATE 10 MG PO TABS
10.0000 mg | ORAL_TABLET | Freq: Every day | ORAL | Status: DC
  Administered 2013-05-11 – 2013-05-12 (×2): 10 mg via ORAL
  Filled 2013-05-11 (×3): qty 1

## 2013-05-11 NOTE — Care Management Note (Addendum)
  Page 2 of 2   05/11/2013     10:09:02 AM   CARE MANAGEMENT NOTE 05/11/2013  Patient:  Grant Peck,Grant Peck   Account Number:  [id number]  Date Initiated:  05/07/2013  Documentation initiated by:  [facility]AL[facility]  Subjective/Objective Assessment:   CHF, pneumonia     Action/Plan:   HH  staying with son, pt from [place]   Anticipated DC Date:     Anticipated DC Plan:  HOME W HOME HEALTH SERVICES      DC Planning Services  CM consult      Choice offered to / List presented to:             Status of service:  In process, will continue to follow Medicare Important Message given?   (If response is "NO", the following Medicare IM given date fields will be blank) Date Medicare IM given:   Date Additional Medicare IM given:    Discharge Disposition:    Per UR Regulation:    If discussed at Long Length of Stay Meetings, dates discussed:    Comments:  05/11/2013  [address] Admitted with bradycardia, CApnemonia, CHF. Successful implantation of a Medtronic Sensia pacemaker for symptomatic complete heart block and atrial fibrillation on 05/10/2013 Dispostion Plan pending Abbrielle Batts RN, BSN, [facility], [place] [phone]   Sharmon Leydenymeeka Davis, RN Registered Nurse Signed CASE MANAGEMENT Care Management Note Service date: 05/08/2013 1:34 PM Chart reviewed for continued stay. Pt oxygen saturation documented by RN qualifies pt for home oxygen therapy. Documentation only valid for 24 hrs. Cm to continue to follow for possible hh needs. Pt plans to return to [place]Currently visiting son.

## 2013-05-11 NOTE — Progress Notes (Signed)
SUBJECTIVE: The patient is doing well today.  At this time, he denies chest pain, shortness of breath, or any new concerns.  Marland Kitchen. amantadine  100 mg Oral Daily  . amLODipine  10 mg Oral Daily  . aspirin  325 mg Oral QPM  . atorvastatin  40 mg Oral q1800  . azithromycin  500 mg Oral Daily  . carvedilol  6.25 mg Oral BID WC  .  ceFAZolin (ANCEF) IV  1 g Intravenous Q6H  . finasteride  5 mg Oral Daily  . furosemide  40 mg Oral BID  . insulin aspart  0-15 Units Subcutaneous TID WC  . lisinopril  40 mg Oral Daily  . pantoprazole  40 mg Oral Daily  . potassium chloride  40 mEq Oral BID      OBJECTIVE: Physical Exam: Filed Vitals:   05/10/13 2103 05/10/13 2338 05/11/13 0450 05/11/13 0500  BP: 181/56 196/70 190/73   Pulse: 60 67 62   Temp: 98.4 F (36.9 C) 98.2 F (36.8 C) 98.1 F (36.7 C)   TempSrc: Oral Oral Oral   Resp: 16 14 17    Height:      Weight:  208 lb 5.4 oz (94.5 kg)  208 lb 5.4 oz (94.5 kg)  SpO2: 93% 92% 95%     Intake/Output Summary (Last 24 hours) at 05/11/13 0706 Last data filed at 05/10/13 1300  Gross per 24 hour  Intake    120 ml  Output      2 ml  Net    118 ml    Telemetry reveals afib with V pacing  GEN- The patient is elderly appearing, alert and oriented x 3 today.   Head- normocephalic, atraumatic Eyes-  Sclera clear, conjunctiva pink Ears- hearing intact Oropharynx- clear Neck- supple  Lungs- Clear to ausculation bilaterally, normal work of breathing Heart- Regular rate and rhythm (paced) GI- soft, NT, ND, + BS Extremities- no clubbing, cyanosis, or edema Pacemaker pocket is without hematoma Neuro- strength and sensation are intact  LABS: Basic Metabolic Panel:  Recent Labs  45/40/9804/15/15 0310 05/11/13 0404  NA 141 139  K 3.5* 4.9  CL 99 99  CO2 29 25  GLUCOSE 160* 176*  BUN 18 14  CREATININE 0.96 0.94  CALCIUM 8.8 9.8   Liver Function Tests: No results found for this basename: AST, ALT, ALKPHOS, BILITOT, PROT, ALBUMIN,  in the  last 72 hours No results found for this basename: LIPASE, AMYLASE,  in the last 72 hours CBC:  Recent Labs  05/09/13 0401  WBC 7.8  HGB 15.2  HCT 45.9  MCV 92.9  PLT 184   Cardiac Enzymes: No results found for this basename: CKTOTAL, CKMB, CKMBINDEX, TROPONINI,  in the last 72 hours BNP: No components found with this basename: POCBNP,  D-Dimer: No results found for this basename: DDIMER,  in the last 72 hours Hemoglobin A1C: No results found for this basename: HGBA1C,  in the last 72 hours Fasting Lipid Panel: No results found for this basename: CHOL, HDL, LDLCALC, TRIG, CHOLHDL, LDLDIRECT,  in the last 72 hours Thyroid Function Tests: No results found for this basename: TSH, T4TOTAL, FREET3, T3FREE, THYROIDAB,  in the last 72 hours Anemia Panel: No results found for this basename: VITAMINB12, FOLATE, FERRITIN, TIBC, IRON, RETICCTPCT,  in the last 72 hours  RADIOLOGY: Dg Chest 2 View  05/08/2013   CLINICAL DATA:  Hypertension, diabetes, CHF  EXAM: CHEST  2 VIEW  COMPARISON:  05/06/2013  FINDINGS: Persistent cardiomegaly with  central vascular congestion. Similar small effusions, best appreciated on the lateral view. Similar mild interstitial opacities in the right lower lobe and left upper lobe as before, compatible with asymmetric edema, less likely pneumonia. Trachea is midline. Atherosclerosis of the aorta. Rib deformities on the right with pleural thickening, suspect remote trauma.  IMPRESSION: Cardiomegaly with right lower lobe and left upper lobe mild interstitial edema pattern.   Electronically Signed   By: Ruel Favorsrevor  Shick M.D.   On: 05/08/2013 08:37   Dg Chest 2 View  05/06/2013   CLINICAL DATA:  COUGH SHORTNESS OF BREATH  EXAM: CHEST  2 VIEW  COMPARISON:  None.  FINDINGS: Low lung volumes. Cardiac silhouette is enlarged. Diffuse bilateral pulmonary opacities appreciated left greater than right. There is mild prominence of interstitial markings and areas of mild peribronchial  cuffing. Increased density within the lung bases right greater than left. Blunting of the costophrenic angles. Mild osteoarthritic changes within the shoulders.  IMPRESSION: Interstitial findings likely reflecting pulmonary edema  Areas of atelectasis versus asymmetric edema within the lung bases  Trace bilateral effusions.  A non edematous infiltrate particularly within the right lung base cannot be excluded. Continued surveillance evaluation recommended.   Electronically Signed   By: Salome HolmesHector  Cooper M.D.   On: 05/06/2013 13:12    ASSESSMENT AND PLAN:  Principal Problem:   Acute CHF (congestive heart failure) Active Problems:   CAP (community acquired pneumonia)   Bradycardia   Atrial fibrillation   DM type 2 (diabetes mellitus, type 2)   Acute respiratory failure with hypoxia  1. Complete heart block Pacemaker interrogation this am is reviewed and normal Chest xray is normal Lyme titer sent  2. Permanent afib Continue rate control Not felt to be a candidate for anticoagulation  3. Hypertension BP is markedly elevated Increase norvasc and add coreg  4. Acute diastolic dysfunction Hopefully this will improve s/p PPM  Routine wound care, caution with left arm movement x 6 weeks Follow-up with primary cardiologist in [place] in 1 week  Electrophysiology team to see as needed while here. Please call with questions.  General cardiology to follow while here.   Hillis RangeJames Rosezetta Balderston, MD 05/11/2013 7:06 AM

## 2013-05-11 NOTE — Progress Notes (Signed)
[facility] PROGRESS NOTE  Grant LyeDonald Kelleher AVW:[id number] DOB: 08/23/1930 DOA: 05/06/2013  PCP: Dr. Linward Foster[place]unning? [place]  Brief HPI: Grant Peck is a 80 y.o. male with a past medical history of atrial fibrillation not on anticoagulation, diabetes mellitus type II, hypertension, who denied any history of congestive heart failure who was in his usual state of health till about 2 days prior to admission, when he started developing shortness of breath, and cough. He was admitted with acute CHF with possible pneumonia. [place]. He was found to be in complete block and [facility] . He was seen by Dr Johney FrameAllred and underwent PPM placement on 4/16. He will be transferred to telemetry.   Consultants: Cardiology  Procedures:  LE Venous Doppler 4/11 No DVT Pacemaker placement on 4/16  2D ECHO 4/12 Left ventricle: The cavity size was normal. Systolic function was vigorous. The estimated ejection fraction was in the range of 65% to 70%. Wall motion was normal; there were no regional wall motion abnormalities. - Aortic valve: Trileaflet; mildly thickened leaflets. Moderate regurgitation. Valve area: 3.26cm^2 (Vmax). - Aortic root: The aortic root was normal in size. - Mitral valve: No regurgitation. - Left atrium: The atrium was moderately dilated. - Right ventricle: Systolic function was normal. - Right atrium: The atrium was normal in size. - Tricuspid valve: No regurgitation. - Pulmonary arteries: Systolic pressure was within the normal range.  Antibiotics: Ceftriaxone 4/11-->4/13 Azithromycin 4/11--> Vantin 4/13-->  Subjective: Doing well No new complaints.   Objective: Vital Signs  Filed Vitals:   05/11/13 0450 05/11/13 0500 05/11/13 0737 05/11/13 1237  BP: 190/73  203/90 147/72  Pulse: 62  65 64  Temp: 98.1 F (36.7 C)  97.4 F (36.3 C) 97.6 F (36.4 C)  TempSrc: Oral   Oral Oral  Resp: 17  16 16   Height:      Weight:  94.5 kg (208 lb 5.4 oz)    SpO2: 95%  95% 95%    Intake/Output Summary (Last 24 hours) at 05/11/13 1239 Last data filed at 05/11/13 0958  Gross per 24 hour  Intake    180 ml  Output      1 ml  Net    179 ml   Filed Weights   05/10/13 0452 05/10/13 2338 05/11/13 0500  Weight: 91.899 kg (202 lb 9.6 oz) 94.5 kg (208 lb 5.4 oz) 94.5 kg (208 lb 5.4 oz)    General appearance: alert, cooperative, appears stated age, no distress and moderately obese Resp: crackles bilaterally but less than yesterday. Improved air entry. No wheezing. Cardio: S1S2 , regular, No s3s4. No rubs bruits, murmurs. No pedal edema GI: soft, non-tender; bowel sounds normal; no masses,  no organomegaly Neurologic: Alert and oriented x 3. No focal deficits.  Lab Results:  Basic Metabolic Panel:  Recent Labs Lab 05/06/13 1345 05/06/13 1605 05/07/13 0625 05/08/13 0333 05/09/13 0401 05/10/13 0310 05/11/13 0404  NA 137  --  139 139 142 141 139  K 4.1  --  3.6* 3.3* 3.7 3.5* 4.9  CL 100  --  100 98 98 99 99  CO2 21  --  26 27 30 29 25   GLUCOSE 185*  --  157* 165* 152* 160* 176*  BUN 13  --  15 18 19 18 14   CREATININE 0.92  --  1.01 0.97 1.01 0.96 0.94  CALCIUM 9.3  --  8.9 8.6 9.2  8.8 9.8  MG  --  1.6  --   --   --   --   --    Liver Function Tests:  Recent Labs Lab 05/06/13 1345  AST 45*  ALT 49  ALKPHOS 107  BILITOT 1.2  PROT 7.2  ALBUMIN 3.5   CBC:  Recent Labs Lab 05/06/13 1345 05/07/13 0625 05/08/13 0333 05/09/13 0401  WBC 10.3 8.6 8.6 7.8  NEUTROABS 8.3*  --   --   --   HGB 14.4 13.5 14.2 15.2  HCT 42.3 41.1 42.1 45.9  MCV 91.4 92.6 92.3 92.9  PLT 147* 146* 159 184   Cardiac Enzymes:  Recent Labs Lab 05/06/13 1605 05/06/13 2145 05/07/13 0625  TROPONINI <0.30 <0.30 <0.30   BNP (last 3 results)  Recent Labs  05/06/13 1345 05/07/13 0625  PROBNP 4582.0* 2384.0*   CBG:  Recent Labs Lab 05/10/13 1234 05/10/13 1523  05/10/13 1741 05/10/13 2119 05/11/13 0818  GLUCAP 168* 119* 132* 268* 161*    Recent Results (from the past 240 hour(s))  CULTURE, BLOOD (ROUTINE X 2)     Status: None   Collection Time    05/06/13  1:45 PM      Result Value Ref Range Status   Specimen Description BLOOD RIGHT ANTECUBITAL   Final   Special Requests BOTTLES DRAWN AEROBIC AND ANAEROBIC 4CC EACH   Final   Culture  Setup Time     Final   Value: 05/06/2013 17:24     Performed at [facility]   Culture     Final   Value:        BLOOD CULTURE RECEIVED NO GROWTH TO DATE CULTURE WILL BE HELD FOR 5 DAYS BEFORE ISSUING A FINAL NEGATIVE REPORT     Performed at [facility]   Report Status PENDING   Incomplete  CULTURE, BLOOD (ROUTINE X 2)     Status: None   Collection Time    05/06/13  2:00 PM      Result Value Ref Range Status   Specimen Description BLOOD LEFT ARM  5 ML IN [facility]   Final   Special Requests Normal   Final   Culture  Setup Time     Final   Value: 05/06/2013 17:24     Performed at [facility]   Culture     Final   Value:        BLOOD CULTURE RECEIVED NO GROWTH TO DATE CULTURE WILL BE HELD FOR 5 DAYS BEFORE ISSUING A FINAL NEGATIVE REPORT     Performed at [facility]   Report Status PENDING   Incomplete      Studies/Results: Dg Chest 2 View  05/11/2013   CLINICAL DATA:  Status post device placement.  EXAM: CHEST  2 VIEW  COMPARISON:  05/08/2013  FINDINGS: New left anterior chest wall single lead pacemaker has its lead superimposed over the right ventricle. There is no pneumothorax.  Prominent interstitial markings in the lungs similar to the prior exam, and may all be chronic, reflect mild residual interstitial edema or be due to a combination, favored. No focal areas of lung consolidation. Lungs are hyperexpanded.  No pleural effusion.  IMPRESSION: 1. New left anterior single lead pacemaker is well positioned. No pneumothorax. 2. Mild residual interstitial edema is  suggested superimposed on chronic interstitial thickening. No new abnormalities.   Electronically Signed   By: Amie Portlandavid  Ormond M.D.   On: 05/11/2013 08:08    Medications:  Scheduled: . amantadine  100 mg Oral Daily  . amLODipine  10 mg Oral Daily  . aspirin  325 mg Oral QPM  . atorvastatin  40 mg Oral q1800  . azithromycin  500 mg Oral Daily  . carvedilol  6.25 mg Oral BID WC  . finasteride  5 mg Oral Daily  . furosemide  40 mg Oral BID  . insulin aspart  0-15 Units Subcutaneous TID WC  . lisinopril  40 mg Oral Daily  . pantoprazole  40 mg Oral Daily  . potassium chloride  40 mEq Oral BID   Continuous:  BJY:[id number], albuterol, hydrALAZINE, HYDROcodone-acetaminophen, ondansetron (ZOFRAN) IV  Assessment/Plan:  Principal Problem:   Acute CHF (congestive heart failure) Active Problems:   CAP (community acquired pneumonia)   Bradycardia   Atrial fibrillation   DM type 2 (diabetes mellitus, type 2)   Acute respiratory failure with hypoxia    Acute respiratory failure with hypoxia Much improved. This was most likely secondary to pulmonary edema/congestive heart failure. RA sats dropped with ambulation on 4/13 but normal at rest on RA. This will need to be repeated on 4/15.   Acute CHF and Hypertension ECHO report reviewed. Normal EF and no mention of diastolic HF. Bradycardia could have contributed. He is diuresing well. Diuretics changed to oral by cards. On Potassium as well. Strict I/O. Daily weights. BP not well controlled. Increased ACEI 4/12 and Amlodipine added by cards 4/13.  Troponins are negative.  Bradycardia in setting of Atrial Fibrillation Cardiology consulted .  Continue Tele. Holding BB and CCB. Not on anticoagulation. Has history of subdural hematoma 2 years ago. TSH is normal. As HR still in 40's Cardiology consulted EP and he is scheduled for PPM implantation and he underwent the PPM placement on 4/16 and transferred to telemetry.   Possible CAP Continue  oral antibiotics. Repeat CXR pending more suggestive of CHF but he was exhibiting signs of bronchitis. He should complete a 7 day course.  Diabetes mellitus, type II Holding his metformin. Continue SSI. HbA1c is 8.0. CBG's stable. CBG (last 3)   Recent Labs  05/10/13 1741 05/10/13 2119 05/11/13 0818  GLUCAP 132* 268* 161*      Enlarged Right Leg No DVT noted on doppler study.   History of subdural hematoma 2 years ago No recent falls or injuries. No neurological deficits.   DVT Prophylaxis: Lovenox  Code Status: Full code  Family Communication: Discussed with patient.  Disposition Plan: possible d/c in am.    LOS: 5 days  Kathlen Mody, MD [facility] [phone]  05/11/2013, 12:39 PM  If 8PM-8AM, please contact night-coverage at [email or web address], password [facility]

## 2013-05-11 NOTE — Discharge Instructions (Signed)
° °  Supplemental Discharge Instructions for  Pacemaker/Defibrillator Patients  Activity No heavy lifting or vigorous activity with your left/right arm for 6 to 8 weeks.  Do not raise your left/right arm above your head for one week.  Gradually raise your affected arm as drawn below.           04/19                      04/20                       04/21                      04/22       NO DRIVING for 1 week; you may begin driving on 40/98/119104/24/2015. WOUND CARE   Keep the wound area clean and dry.  Do not get this area wet for one week. No showers for one week; you may shower on 05/19/2013.   The tape/steri-strips on your wound will fall off; do not pull them off.  No bandage is needed on the site.  DO  NOT apply any creams, oils, or ointments to the wound area.   If you notice any drainage or discharge from the wound, any swelling or bruising at the site, or you develop a fever > 101? F after you are discharged home, call the office at once.  Special Instructions   You are still able to use cellular telephones; use the ear opposite the side where you have your pacemaker/defibrillator.  Avoid carrying your cellular phone near your device.   When traveling through airports, show security personnel your identification card to avoid being screened in the metal detectors.  Ask the security personnel to use the hand wand.   Avoid arc welding equipment, MRI testing (magnetic resonance imaging), TENS units (transcutaneous nerve stimulators).  Call the office for questions about other devices.   Avoid electrical appliances that are in poor condition or are not properly grounded.   Microwave ovens are safe to be near or to operate.

## 2013-05-12 LAB — CULTURE, BLOOD (ROUTINE X 2)
CULTURE: NO GROWTH
Culture: NO GROWTH
SPECIAL REQUESTS: NORMAL

## 2013-05-12 LAB — GLUCOSE, CAPILLARY: GLUCOSE-CAPILLARY: 165 mg/dL — AB (ref 70–99)

## 2013-05-12 MED ORDER — FUROSEMIDE 40 MG PO TABS: 40.0000 mg | ORAL_TABLET | Freq: Two times a day (BID) | ORAL | Status: AC

## 2013-05-12 MED ORDER — POTASSIUM CHLORIDE CRYS ER 20 MEQ PO TBCR: 40.0000 meq | EXTENDED_RELEASE_TABLET | Freq: Every day | ORAL | Status: AC

## 2013-05-12 MED ORDER — CARVEDILOL 12.5 MG PO TABS
12.5000 mg | ORAL_TABLET | Freq: Two times a day (BID) | ORAL | Status: DC
  Administered 2013-05-12: 12.5 mg via ORAL

## 2013-05-12 MED ORDER — AMLODIPINE BESYLATE 10 MG PO TABS: 10.0000 mg | ORAL_TABLET | Freq: Every day | ORAL | Status: AC

## 2013-05-12 MED ORDER — LEVOFLOXACIN 500 MG PO TABS: 500.0000 mg | ORAL_TABLET | Freq: Every day | ORAL | Status: AC

## 2013-05-12 MED ORDER — AMLODIPINE BESYLATE 10 MG PO TABS: 10.0000 mg | ORAL_TABLET | Freq: Every day | ORAL | Status: DC

## 2013-05-12 MED ORDER — CARVEDILOL 12.5 MG PO TABS
12.5000 mg | ORAL_TABLET | Freq: Two times a day (BID) | ORAL | Status: AC
Start: 2013-05-12 — End: ?

## 2013-05-12 MED ORDER — FUROSEMIDE 40 MG PO TABS: 40.0000 mg | ORAL_TABLET | Freq: Two times a day (BID) | ORAL | Status: DC

## 2013-05-12 MED ORDER — CARVEDILOL 12.5 MG PO TABS: 12.5000 mg | ORAL_TABLET | Freq: Two times a day (BID) | ORAL | Status: DC

## 2013-05-12 NOTE — Progress Notes (Signed)
Patient: Grant Peck / Admit Date: 05/06/2013 / Date of Encounter: 05/12/2013, 8:35 AM  Subjective  "I feel great!" No CP, palpitations, SOB, LEE.  Objective   Telemetry: atrial fibrillation with increased rate 110s, occ V pacing  Physical Exam: Blood pressure 162/114, pulse 74, temperature 98 F (36.7 C), temperature source Oral, resp. rate 18, height 5\' 7"  (1.702 m), weight 207 lb 0.2 oz (93.9 kg), SpO2 95.00%. General: Well developed, well nourished WM in no acute distress. Head: Normocephalic, atraumatic, sclera non-icteric, no xanthomas, nares are without discharge. Neck: Negative for carotid bruits. JVP not elevated. Lungs: Clear bilaterally to auscultation without wheezes, rales, or rhonchi. Breathing is unlabored. Heart: RRR S1 S2 without murmurs, rubs, or gallops. PPM left uppper chest with steri strips in place, no erythema dehiscence or suppuration Abdomen: Soft, non-tender, non-distended with normoactive bowel sounds. No rebound/guarding. Extremities: No clubbing or cyanosis. No edema. Distal pedal pulses are 2+ and equal bilaterally. Neuro: Alert and oriented X 3. Moves all extremities spontaneously. Psych:  Responds to questions appropriately with a normal affect.   Intake/Output Summary (Last 24 hours) at 05/12/13 0835 Last data filed at 05/11/13 2200  Gross per 24 hour  Intake    640 ml  Output      0 ml  Net    640 ml    Inpatient Medications:  . amantadine  100 mg Oral Daily  . amLODipine  10 mg Oral Daily  . aspirin  325 mg Oral QPM  . atorvastatin  40 mg Oral q1800  . azithromycin  500 mg Oral Daily  . carvedilol  6.25 mg Oral BID WC  . finasteride  5 mg Oral Daily  . furosemide  40 mg Oral BID  . insulin aspart  0-15 Units Subcutaneous TID WC  . lisinopril  40 mg Oral Daily  . pantoprazole  40 mg Oral Daily  . potassium chloride  40 mEq Oral BID   Infusions:    Labs:  Recent Labs  05/10/13 0310 05/11/13 0404  NA 141 139  K 3.5* 4.9  CL 99 99   CO2 29 25  GLUCOSE 160* 176*  BUN 18 14  CREATININE 0.96 0.94  CALCIUM 8.8 9.8    Radiology/Studies:  Dg Chest 2 View  05/11/2013   CLINICAL DATA:  Status post device placement.  EXAM: CHEST  2 VIEW  COMPARISON:  05/08/2013  FINDINGS: New left anterior chest wall single lead pacemaker has its lead superimposed over the right ventricle. There is no pneumothorax.  Prominent interstitial markings in the lungs similar to the prior exam, and may all be chronic, reflect mild residual interstitial edema or be due to a combination, favored. No focal areas of lung consolidation. Lungs are hyperexpanded.  No pleural effusion.  IMPRESSION: 1. New left anterior single lead pacemaker is well positioned. No pneumothorax. 2. Mild residual interstitial edema is suggested superimposed on chronic interstitial thickening. No new abnormalities.   Electronically Signed   By: Amie Portlandavid  Ormond M.D.   On: 05/11/2013 08:08      Assessment and Plan   1. Acute diastolic CHF (EF 96-04%65-70%), suspect related to #1 2.  Permanent atrial fibrillation with slow ventricular response and periods of junctional bradycardia s/p Medtronic pacemaker 05/10/13, now with elevated rates 3. Acute respiratory failure with hypoxia felt due to CHF 4. HTN 5. Diabetes mellitus 6. Moderate AI 7. Acute bronchitis 8. History of SDH, which is why he is not on Coumadin (hx of frequent falls)  I/O's not  accurate but appears euvolemic. Continue oral Lasix. BP still running high. Amlodipine was just increased yesterday. Will increase Coreg to 12.5mg  BID starting this AM. If HR and BP better this afternoon, suspect OK to DC with close followup planned on Monday. Pacemaker instructions have already been entered by EP team and he has f/u with his cardiologist in [place] on 4/20.   Signed, Ronie Spiesayna Dunn PA-C  The patient was seen, examined and discussed with Ronie Spiesayna Dunn, PA-C and I agree with the above.   A 80 year old male admitted with acute on chronic  diastolic CHF believed to be secondary to a-fib with RVR. He diuresed well and is feeling significantly better. Now appears euvolemic.  We will discharge home today with a close follow up in our clinic.  His BP and HR still elevated, we would increase carvedilol to 12.5 mg po BID.   Lars MassonKatarina H Valeska Haislip 05/12/2013

## 2013-05-12 NOTE — Discharge Summary (Addendum)
Physician Discharge Summary  Jariah Jarmon ZOX:[id number] DOB: [birth year] DOA: 05/06/2013  PCP: No primary provider on file.  Admit date: 05/06/2013 Discharge date: 05/12/2013  Time spent: 30 minutes  Recommendations for Outpatient Follow-up:  followu  pwith cardiology as recommended Follow up with PCP in one week.  Discharge Diagnoses:  Principal Problem:   Acute  Diastolic CHF (congestive heart failure) Active Problems:   CAP (community acquired pneumonia)   Bradycardia   Atrial fibrillation   DM type 2 (diabetes mellitus, type 2)   Acute respiratory failure with hypoxia   Discharge Condition: improved.   Diet recommendation: low sodium and carb modified   Filed Weights   05/10/13 2338 05/11/13 0500 05/12/13 0005  Weight: 94.5 kg (208 lb 5.4 oz) 94.5 kg (208 lb 5.4 oz) 93.9 kg (207 lb 0.2 oz)    History of present illness:  Grant Peck is a 80 y.o. male with a past medical history of atrial fibrillation not on anticoagulation, diabetes mellitus type II, hypertension, who denied any history of congestive heart failure who was in his usual state of health till about 2 days prior to admission, when he started developing shortness of breath, and cough. He was admitted with acute CHF with possible pneumonia. He is visiting from [place]. 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 He was seen by Dr Johney Frame and underwent PPM placement on 4/16. He will be discharged home to follow up with cardiology on Monday.    Hospital Course:  Acute respiratory failure with hypoxia  Much improved. This was most likely secondary to pulmonary edema/congestive heart failure. RA sats dropped with ambulation on 4/13 but normal at rest on RA. It was repeated on discharge and his sat's have remained good on RA and on ambulation.  Acute CHF and Hypertension  ECHO report reviewed. Normal EF and no mention of diastolic HF.  Bradycardia could have contributed. He is diuresing well. Diuretics changed to oral by cards. On Potassium as well.   Troponins are negative.  Bradycardia in setting of Atrial Fibrillation  Cardiology consulted . Has history of subdural hematoma 2 years ago. TSH is normal. As HR still in 40's Cardiology consulted EP and he is scheduled for PPM implantation and he underwent the PPM placement on 4/16 and transferred to telemetry. He is discharged home to follow up with cardiology on Monday . Possible CAP  Continue oral antibiotics. Repeat CXR pending more suggestive of CHF but he was exhibiting signs of bronchitis. He should complete a 7 day course.  Diabetes mellitus, type II  Resume home medications.  CBG (last 3)   Recent Labs   05/10/13 1741  05/10/13 2119  05/11/13 0818   GLUCAP  132*  268*  161*    Enlarged Right Leg  No DVT noted on doppler study.  History of subdural hematoma 2 years ago  No recent falls or injuries. No neurological deficits.    Procedures:  PPM placement.   Consultations:  cardiology  Discharge Exam: Filed Vitals:   05/12/13 0729  BP: 162/114  Pulse: 74  Temp: 98 F (36.7 C)  Resp: 18    General appearance: alert, cooperative, appears stated age, no distress and moderately obese  Resp:  Improved air entry. No wheezing.  Cardio: S1S2 , regular, No s3s4. No rubs bruits, murmurs. No pedal edema  GI: soft, non-tender; bowel sounds normal; no masses, no organomegaly  Neurologic: Alert  and oriented x 3. No focal deficits.   Discharge Instructions You were cared for by a hospitalist during your hospital stay. If you have any questions about your discharge medications or the care you received while you were in the hospital after you are discharged, you can call the unit and asked to speak with the hospitalist on call if the hospitalist that took care of you is not available. Once you are discharged, your primary care physician will handle any further  medical issues. Please note that NO REFILLS for any discharge medications will be authorized once you are discharged, as it is imperative that you return to your primary care physician (or establish a relationship with a primary care physician if you do not have one) for your aftercare needs so that they can reassess your need for medications and monitor your lab values.  Discharge Orders   Future Orders Complete By Expires   (HEART FAILURE PATIENTS) Call MD:  Anytime you have any of the following symptoms: 1) 3 pound weight gain in 24 hours or 5 pounds in 1 week 2) shortness of breath, with or without a dry hacking cough 3) swelling in the hands, feet or stomach 4) if you have to sleep on extra pillows at night in order to breathe.  As directed    Call MD for:  difficulty breathing, headache or visual disturbances  As directed    Call MD for:  extreme fatigue  As directed    Call MD for:  persistant dizziness or light-headedness  As directed    Call MD for:  redness, tenderness, or signs of infection (pain, swelling, redness, odor or green/yellow discharge around incision site)  As directed    Diet - low sodium heart healthy  As directed    Discharge instructions  As directed        Medication List    STOP taking these medications       diltiazem 120 MG 24 hr capsule  Commonly known as:  CARDIZEM CD     metoprolol succinate 50 MG 24 hr tablet  Commonly known as:  TOPROL-XL      TAKE these medications       amantadine 100 MG capsule  Commonly known as:  SYMMETREL  Take 100 mg by mouth daily.     amLODipine 10 MG tablet  Commonly known as:  NORVASC  Take 1 tablet (10 mg total) by mouth daily.     aspirin 325 MG tablet  Take 325 mg by mouth every evening.     carvedilol 12.5 MG tablet  Commonly known as:  COREG  Take 1 tablet (12.5 mg total) by mouth 2 (two) times daily with a meal.     CLEAR EYES ALL SEASONS 5-6 MG/ML Soln  Generic drug:  Polyvinyl Alcohol-Povidone  Place 1  drop into both eyes daily as needed (For dry eyes).     dextromethorphan 30 MG/5ML liquid  Commonly known as:  DELSYM  Take 30 mLs by mouth as needed for cough.     finasteride 5 MG tablet  Commonly known as:  PROSCAR  Take 5 mg by mouth daily.     fosinopril 20 MG tablet  Commonly known as:  MONOPRIL  Take 20 mg by mouth daily.     furosemide 40 MG tablet  Commonly known as:  LASIX  Take 1 tablet (40 mg total) by mouth 2 (two) times daily.     metFORMIN 500 MG tablet  Commonly known  as:  GLUCOPHAGE  Take 750 mg by mouth 2 (two) times daily with a meal.     omeprazole 40 MG capsule  Commonly known as:  PRILOSEC  Take 40 mg by mouth daily.     potassium chloride SA 20 MEQ tablet  Commonly known as:  K-DUR,KLOR-CON  Take 2 tablets (40 mEq total) by mouth daily.     rosuvastatin 20 MG tablet  Commonly known as:  CRESTOR  Take 20 mg by mouth at bedtime.     terazosin 10 MG capsule  Commonly known as:  HYTRIN  Take 10 mg by mouth at bedtime.       Allergies  Allergen Reactions  . Ivp Dye [Iodinated Diagnostic Agents] Other (See Comments)    Very "hot feeling", flushed        Follow-up Information   Follow up with Arnoldo Morale, DO In 1 week. (Already scheduled for appointment on Monday 05/15/2013)    Contact information:   [facility] [place], [place]       The results of significant diagnostics from this hospitalization (including imaging, microbiology, ancillary and laboratory) are listed below for reference.    Significant Diagnostic Studies: Dg Chest 2 View  05/11/2013   CLINICAL DATA:  Status post device placement.  EXAM: CHEST  2 VIEW  COMPARISON:  05/08/2013  FINDINGS: New left anterior chest wall single lead pacemaker has its lead superimposed over the right ventricle. There is no pneumothorax.  Prominent interstitial markings in the lungs similar to the prior exam, and may all be chronic, reflect mild residual interstitial edema or  be due to a combination, favored. No focal areas of lung consolidation. Lungs are hyperexpanded.  No pleural effusion.  IMPRESSION: 1. New left anterior single lead pacemaker is well positioned. No pneumothorax. 2. Mild residual interstitial edema is suggested superimposed on chronic interstitial thickening. No new abnormalities.   Electronically Signed   By: Amie Portland M.D.   On: 05/11/2013 08:08   Dg Chest 2 View  05/08/2013   CLINICAL DATA:  Hypertension, diabetes, CHF  EXAM: CHEST  2 VIEW  COMPARISON:  05/06/2013  FINDINGS: Persistent cardiomegaly with central vascular congestion. Similar small effusions, best appreciated on the lateral view. Similar mild interstitial opacities in the right lower lobe and left upper lobe as before, compatible with asymmetric edema, less likely pneumonia. Trachea is midline. Atherosclerosis of the aorta. Rib deformities on the right with pleural thickening, suspect remote trauma.  IMPRESSION: Cardiomegaly with right lower lobe and left upper lobe mild interstitial edema pattern.   Electronically Signed   By: Ruel Favors M.D.   On: 05/08/2013 08:37   Dg Chest 2 View  05/06/2013   CLINICAL DATA:  COUGH SHORTNESS OF BREATH  EXAM: CHEST  2 VIEW  COMPARISON:  None.  FINDINGS: Low lung volumes. Cardiac silhouette is enlarged. Diffuse bilateral pulmonary opacities appreciated left greater than right. There is mild prominence of interstitial markings and areas of mild peribronchial cuffing. Increased density within the lung bases right greater than left. Blunting of the costophrenic angles. Mild osteoarthritic changes within the shoulders.  IMPRESSION: Interstitial findings likely reflecting pulmonary edema  Areas of atelectasis versus asymmetric edema within the lung bases  Trace bilateral effusions.  A non edematous infiltrate particularly within the right lung base cannot be excluded. Continued surveillance evaluation recommended.   Electronically Signed   By: Salome Holmes M.D.   On: 05/06/2013 13:12    Microbiology: Recent Results (from the  past 240 hour(s))  CULTURE, BLOOD (ROUTINE X 2)     Status: None   Collection Time    05/06/13  1:45 PM      Result Value Ref Range Status   Specimen Description BLOOD RIGHT ANTECUBITAL   Final   Special Requests BOTTLES DRAWN AEROBIC AND ANAEROBIC 4CC EACH   Final   Culture  Setup Time     Final   Value: 05/06/2013 17:24     Performed at [facility]   Culture     Final   Value: NO GROWTH 5 DAYS     Performed at [facility]   Report Status 05/12/2013 FINAL   Final  CULTURE, BLOOD (ROUTINE X 2)     Status: None   Collection Time    05/06/13  2:00 PM      Result Value Ref Range Status   Specimen Description BLOOD LEFT ARM  [facility]   Final   Special Requests Normal   Final   Culture  Setup Time     Final   Value: 05/06/2013 17:24     Performed at [facility]   Culture     Final   Value: NO GROWTH 5 DAYS     Performed at [facility]   Report Status 05/12/2013 FINAL   Final     Labs: Basic Metabolic Panel:  Recent Labs Lab 05/06/13 1345 05/06/13 1605 05/07/13 0625 05/08/13 0333 05/09/13 0401 05/10/13 0310 05/11/13 0404  NA 137  --  139 139 142 141 139  K 4.1  --  3.6* 3.3* 3.7 3.5* 4.9  CL 100  --  100 98 98 99 99  CO2 21  --  26 27 30 29 25   GLUCOSE 185*  --  157* 165* 152* 160* 176*  BUN 13  --  15 18 19 18 14   CREATININE 0.92  --  1.01 0.97 1.01 0.96 0.94  CALCIUM 9.3  --  8.9 8.6 9.2 8.8 9.8  MG  --  1.6  --   --   --   --   --    Liver Function Tests:  Recent Labs Lab 05/06/13 1345  AST 45*  ALT 49  ALKPHOS 107  BILITOT 1.2  PROT 7.2  ALBUMIN 3.5   No results found for this basename: LIPASE, AMYLASE,  in the last 168 hours No results found for this basename: AMMONIA,  in the last 168 hours CBC:  Recent Labs Lab 05/06/13 1345 05/07/13 0625 05/08/13 0333 05/09/13 0401  WBC 10.3 8.6 8.6 7.8  NEUTROABS 8.3*  --   --   --    HGB 14.4 13.5 14.2 15.2  HCT 42.3 41.1 42.1 45.9  MCV 91.4 92.6 92.3 92.9  PLT 147* 146* 159 184   Cardiac Enzymes:  Recent Labs Lab 05/06/13 1605 05/06/13 2145 05/07/13 0625  TROPONINI <0.30 <0.30 <0.30   BNP: BNP (last 3 results)  Recent Labs  05/06/13 1345 05/07/13 0625  PROBNP 4582.0* 2384.0*   CBG:  Recent Labs Lab 05/11/13 0818 05/11/13 1241 05/11/13 1738 05/11/13 2132 05/12/13 0729  GLUCAP 161* 144* 136* 179* 165*       Signed:  Kathlen ModyVijaya Russell Quinney  [facility] 05/12/2013, 1:14 PM

## 2014-01-04 ENCOUNTER — Encounter (HOSPITAL_COMMUNITY): Payer: Self-pay | Admitting: Internal Medicine

## 2015-03-23 IMAGING — CR DG CHEST 2V
2 series · 2 of 2 positions shown · non-contrast
Comparison: None.

CLINICAL DATA: COUGH SHORTNESS OF BREATH

EXAM:
CHEST  2 VIEW

[w chest lat]
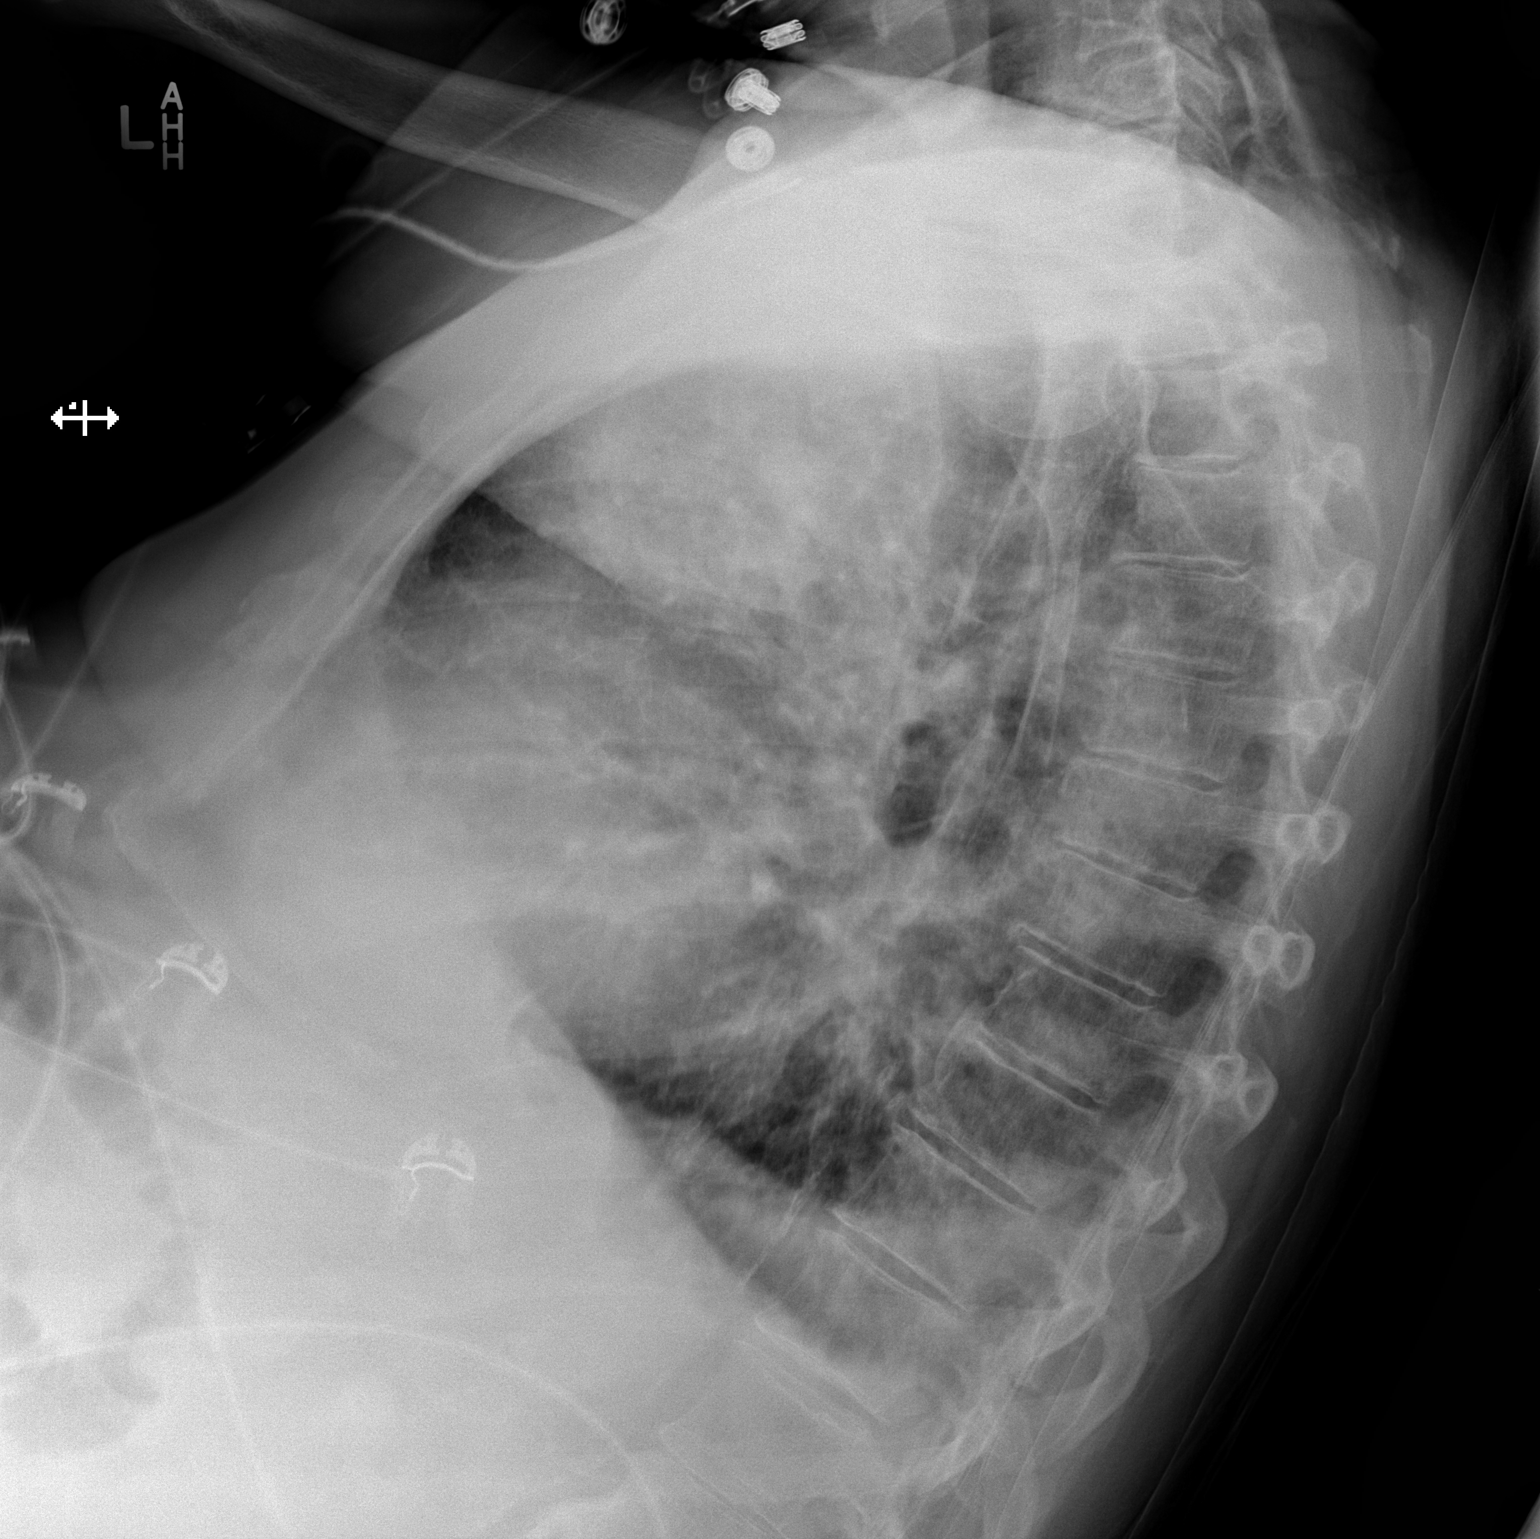

[x chest ap]
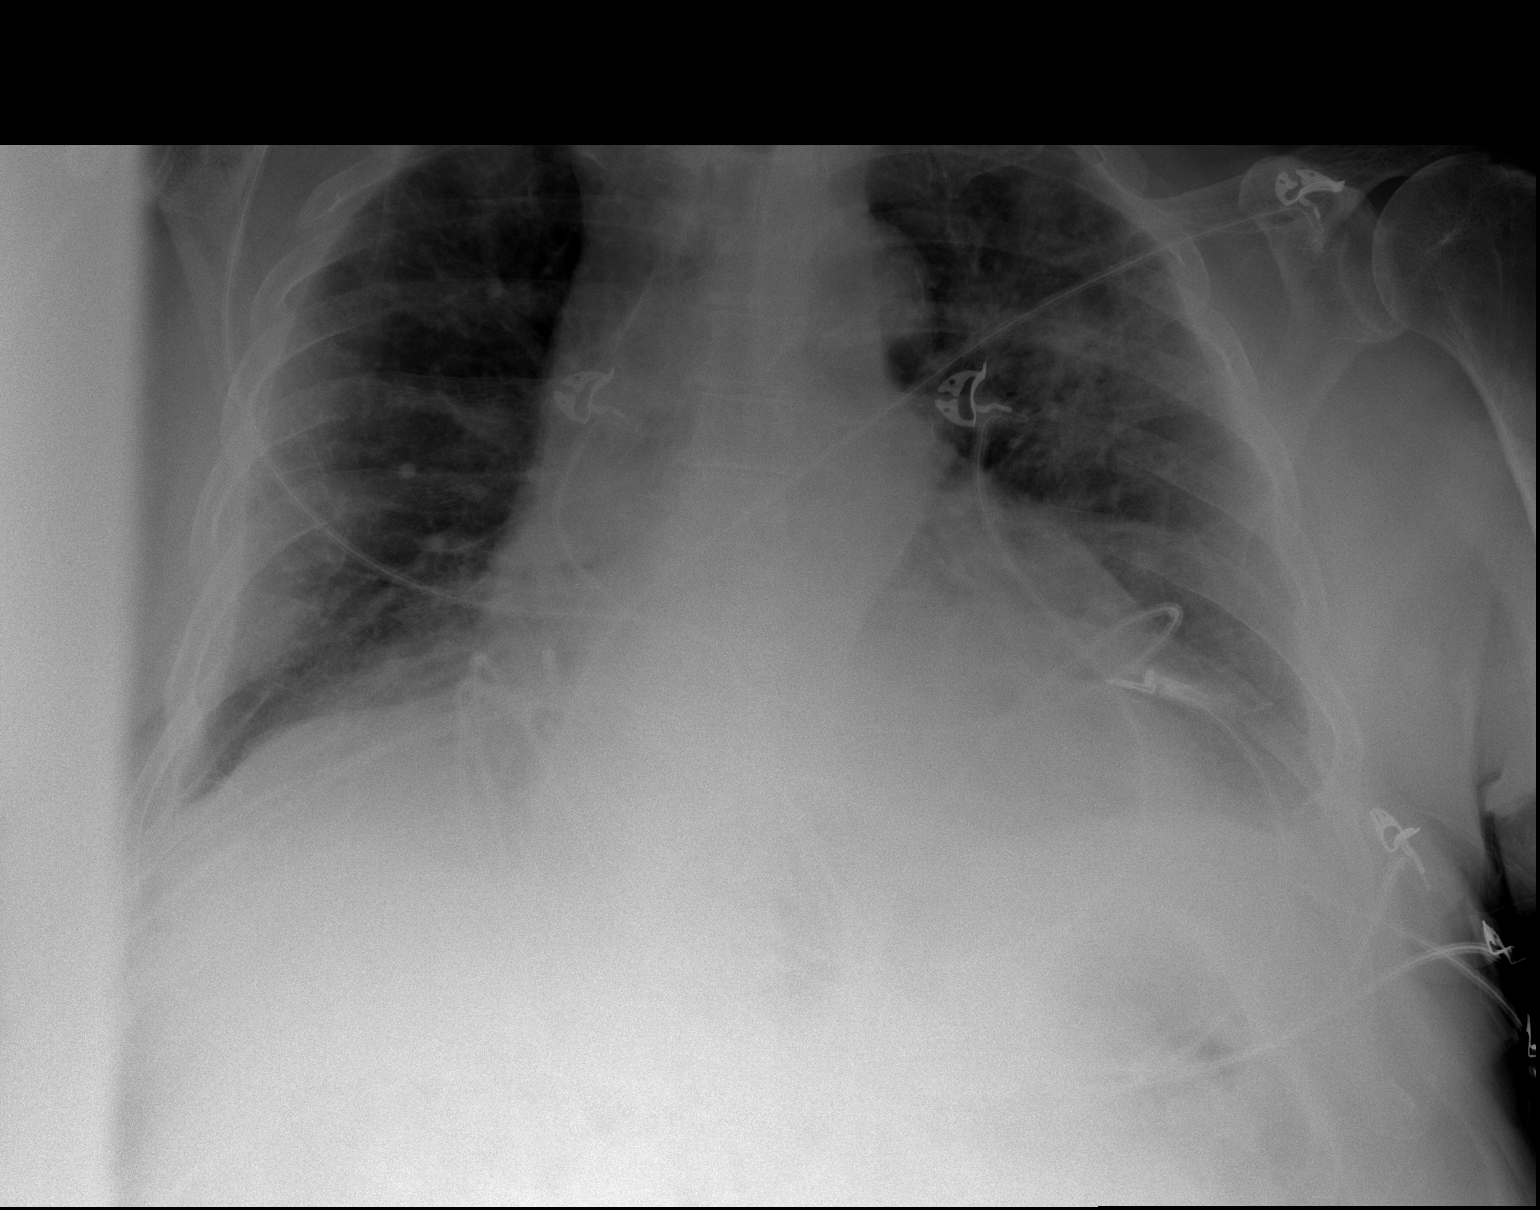

[2 of 2 positions shown; findings below may reference images not displayed]

FINDINGS: Low lung volumes. Cardiac silhouette is enlarged. Diffuse bilateral
pulmonary opacities appreciated left greater than right. There is
mild prominence of interstitial markings and areas of mild
peribronchial cuffing. Increased density within the lung bases right
greater than left. Blunting of the costophrenic angles. Mild
osteoarthritic changes within the shoulders.
IMPRESSION: Interstitial findings likely reflecting pulmonary edema

Areas of atelectasis versus asymmetric edema within the lung bases

Trace bilateral effusions.

A non edematous infiltrate particularly within the right lung base
cannot be excluded. Continued surveillance evaluation recommended.

## 2015-03-25 IMAGING — CR DG CHEST 2V
2 series · 2 of 2 positions shown · non-contrast
Comparison: 05/06/2013

CLINICAL DATA: Hypertension, diabetes, CHF

EXAM:
CHEST  2 VIEW

[w chest pa]
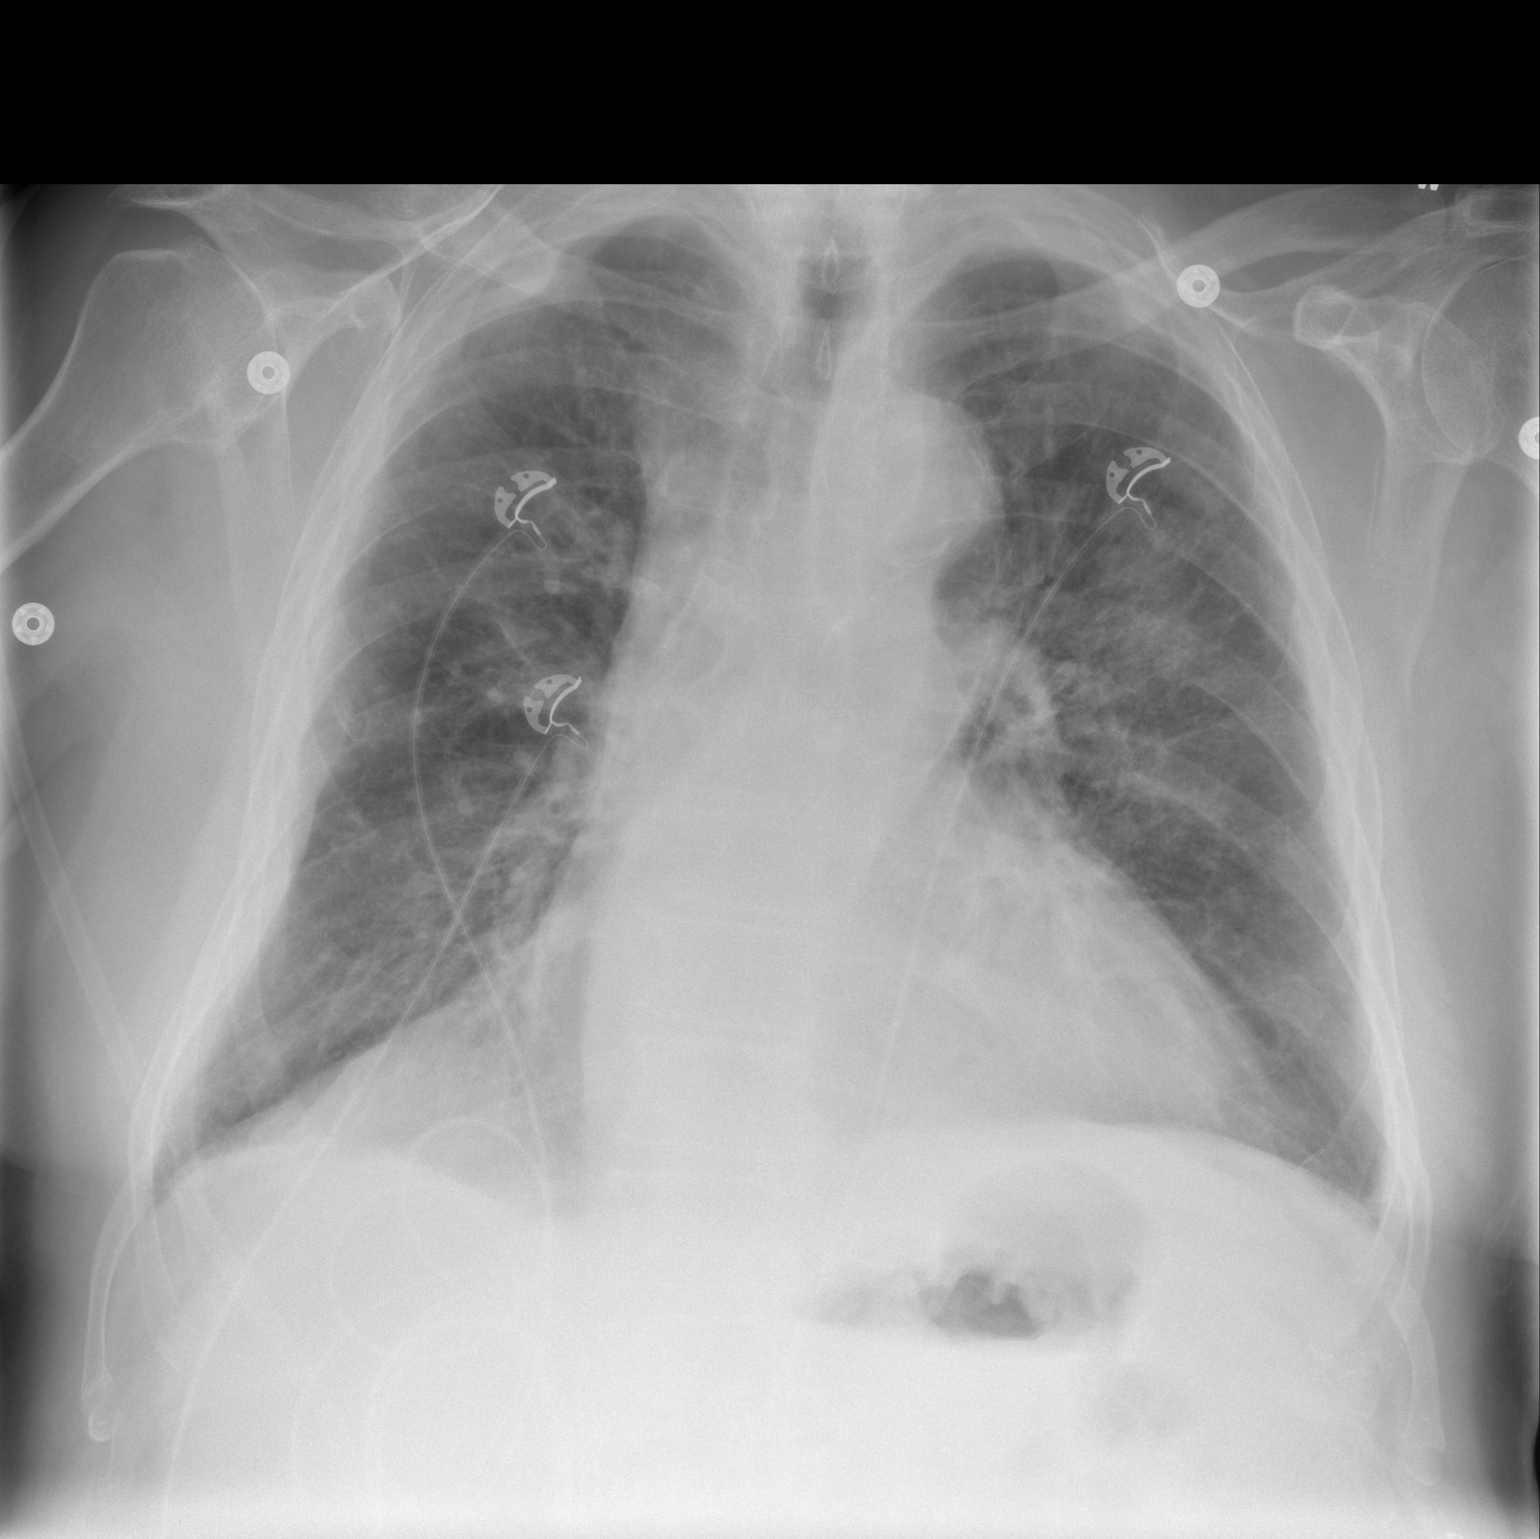

[w chest lat]
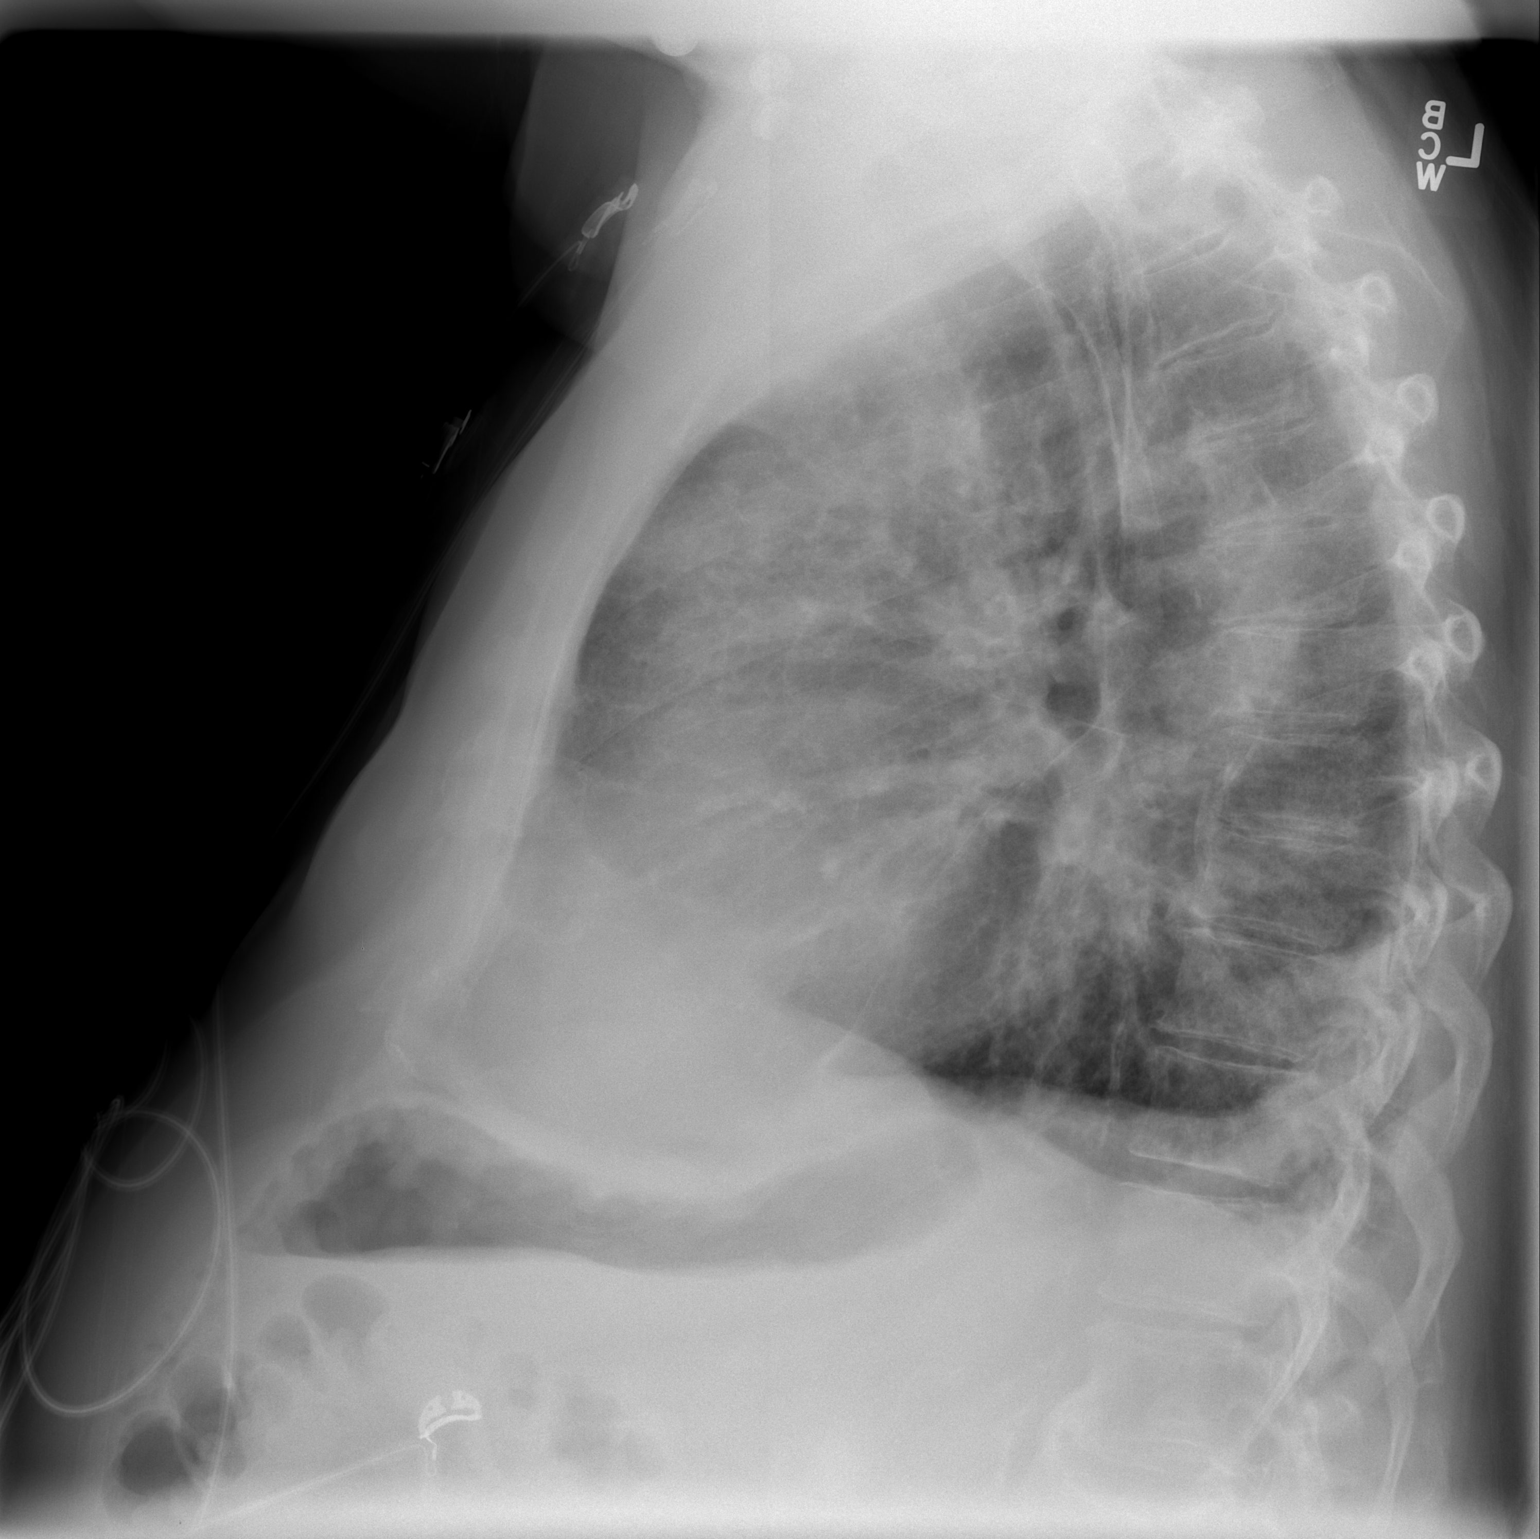

[2 of 2 positions shown; findings below may reference images not displayed]

FINDINGS: Persistent cardiomegaly with central vascular congestion. Similar
small effusions, best appreciated on the lateral view. Similar mild
interstitial opacities in the right lower lobe and left upper lobe
as before, compatible with asymmetric edema, less likely pneumonia.
Trachea is midline. Atherosclerosis of the aorta. Rib deformities on
the right with pleural thickening, suspect remote trauma.
IMPRESSION: Cardiomegaly with right lower lobe and left upper lobe mild
interstitial edema pattern.

## 2023-10-27 DEATH — deceased
